# Patient Record
Sex: Female | Born: 1965 | Race: White | Hispanic: No | Smoking: Current every day smoker
Health system: Southern US, Community
[De-identification: ages and names within clinical notes are randomized; demographics above are authoritative.]

## PROBLEM LIST (undated history)

## (undated) DIAGNOSIS — J189 Pneumonia, unspecified organism: Secondary | ICD-10-CM

## (undated) DIAGNOSIS — J45909 Unspecified asthma, uncomplicated: Secondary | ICD-10-CM

## (undated) DIAGNOSIS — M549 Dorsalgia, unspecified: Secondary | ICD-10-CM

## (undated) DIAGNOSIS — G629 Polyneuropathy, unspecified: Secondary | ICD-10-CM

## (undated) DIAGNOSIS — M1990 Unspecified osteoarthritis, unspecified site: Secondary | ICD-10-CM

## (undated) DIAGNOSIS — M4800 Spinal stenosis, site unspecified: Secondary | ICD-10-CM

## (undated) DIAGNOSIS — M21371 Foot drop, right foot: Secondary | ICD-10-CM

## (undated) DIAGNOSIS — F32A Depression, unspecified: Secondary | ICD-10-CM

## (undated) HISTORY — PX: TUBAL LIGATION: SHX77

## (undated) HISTORY — PX: BREAST LUMPECTOMY: SHX2

## (undated) HISTORY — PX: ABDOMINAL HYSTERECTOMY: SHX81

## (undated) HISTORY — PX: CHOLECYSTECTOMY: SHX55

---

## 1998-05-31 ENCOUNTER — Emergency Department (HOSPITAL_COMMUNITY): Admission: EM | Admit: 1998-05-31 | Discharge: 1998-05-31 | Payer: Self-pay | Admitting: Internal Medicine

## 1998-09-06 ENCOUNTER — Encounter: Payer: Self-pay | Admitting: Emergency Medicine

## 1998-09-06 ENCOUNTER — Emergency Department (HOSPITAL_COMMUNITY): Admission: EM | Admit: 1998-09-06 | Discharge: 1998-09-06 | Payer: Self-pay | Admitting: Emergency Medicine

## 1999-09-29 ENCOUNTER — Emergency Department (HOSPITAL_COMMUNITY): Admission: EM | Admit: 1999-09-29 | Discharge: 1999-09-29 | Payer: Self-pay | Admitting: Emergency Medicine

## 1999-10-15 ENCOUNTER — Encounter: Payer: Self-pay | Admitting: Emergency Medicine

## 1999-10-15 ENCOUNTER — Emergency Department (HOSPITAL_COMMUNITY): Admission: EM | Admit: 1999-10-15 | Discharge: 1999-10-15 | Payer: Self-pay | Admitting: Emergency Medicine

## 2000-05-09 ENCOUNTER — Emergency Department (HOSPITAL_COMMUNITY): Admission: EM | Admit: 2000-05-09 | Discharge: 2000-05-09 | Payer: Self-pay | Admitting: Emergency Medicine

## 2012-06-16 DIAGNOSIS — R519 Headache, unspecified: Secondary | ICD-10-CM | POA: Insufficient documentation

## 2012-06-16 DIAGNOSIS — R51 Headache: Secondary | ICD-10-CM

## 2012-06-16 DIAGNOSIS — F329 Major depressive disorder, single episode, unspecified: Secondary | ICD-10-CM | POA: Insufficient documentation

## 2012-06-16 DIAGNOSIS — F32A Depression, unspecified: Secondary | ICD-10-CM | POA: Insufficient documentation

## 2012-06-22 DIAGNOSIS — E539 Vitamin B deficiency, unspecified: Secondary | ICD-10-CM | POA: Insufficient documentation

## 2013-12-14 ENCOUNTER — Emergency Department (HOSPITAL_COMMUNITY)
Admission: EM | Admit: 2013-12-14 | Discharge: 2013-12-14 | Disposition: A | Discharge status: Home / Self Care | Payer: Self-pay | Attending: Emergency Medicine | Admitting: Emergency Medicine

## 2013-12-14 ENCOUNTER — Encounter (HOSPITAL_COMMUNITY): Payer: Self-pay | Admitting: Emergency Medicine

## 2013-12-14 ENCOUNTER — Emergency Department (HOSPITAL_COMMUNITY): Payer: Self-pay

## 2013-12-14 DIAGNOSIS — R209 Unspecified disturbances of skin sensation: Secondary | ICD-10-CM | POA: Insufficient documentation

## 2013-12-14 DIAGNOSIS — M5416 Radiculopathy, lumbar region: Secondary | ICD-10-CM

## 2013-12-14 DIAGNOSIS — Z881 Allergy status to other antibiotic agents status: Secondary | ICD-10-CM | POA: Insufficient documentation

## 2013-12-14 DIAGNOSIS — IMO0002 Reserved for concepts with insufficient information to code with codable children: Secondary | ICD-10-CM | POA: Insufficient documentation

## 2013-12-14 DIAGNOSIS — M4800 Spinal stenosis, site unspecified: Secondary | ICD-10-CM | POA: Insufficient documentation

## 2013-12-14 DIAGNOSIS — F17200 Nicotine dependence, unspecified, uncomplicated: Secondary | ICD-10-CM | POA: Insufficient documentation

## 2013-12-14 DIAGNOSIS — Z885 Allergy status to narcotic agent status: Secondary | ICD-10-CM | POA: Insufficient documentation

## 2013-12-14 DIAGNOSIS — M48061 Spinal stenosis, lumbar region without neurogenic claudication: Secondary | ICD-10-CM

## 2013-12-14 DIAGNOSIS — M5116 Intervertebral disc disorders with radiculopathy, lumbar region: Secondary | ICD-10-CM

## 2013-12-14 DIAGNOSIS — Z880 Allergy status to penicillin: Secondary | ICD-10-CM | POA: Insufficient documentation

## 2013-12-14 DIAGNOSIS — M533 Sacrococcygeal disorders, not elsewhere classified: Secondary | ICD-10-CM | POA: Insufficient documentation

## 2013-12-14 HISTORY — DX: Dorsalgia, unspecified: M54.9

## 2013-12-14 MED ORDER — PREDNISONE 20 MG PO TABS: ORAL_TABLET | ORAL | Status: DC

## 2013-12-14 MED ORDER — ONDANSETRON 4 MG PO TBDP
8.0000 mg | ORAL_TABLET | Freq: Once | ORAL | Status: AC
  Administered 2013-12-14: 8 mg via ORAL
  Filled 2013-12-14: qty 2

## 2013-12-14 MED ORDER — HYDROCODONE-ACETAMINOPHEN 5-325 MG PO TABS
2.0000 | ORAL_TABLET | Freq: Once | ORAL | Status: AC
  Administered 2013-12-14: 2 via ORAL
  Filled 2013-12-14: qty 2

## 2013-12-14 NOTE — ED Provider Notes (Signed)
Patient's MRI, says she has spinal stenosis with a small bulge at the level of L4-5, which does not explain the patient's, symptoms.  She's been put on a 12 day prednisone taper, and referred to neurosurgery  Garald Balding, NP 12/14/13 2143

## 2013-12-14 NOTE — Discharge Instructions (Signed)
Her MRI shows that you have lumbar spinal stenosis, which is in narrowing of the canal, as well as a small bulge at the level of L4-5.   You have been referred to neurosurgery.  Please call and make an appointment

## 2013-12-14 NOTE — ED Notes (Signed)
Per pt sts for the past couple of days she is having lumbar pain associated with numbness. sts she is numb and burning from her left side to the middle of her abdomen and down her waist. sts she has a prior back injury. sts she has a knot on her back. sts that her legs are weak and she doesn't have control of her bowels or bladder.

## 2013-12-14 NOTE — ED Provider Notes (Signed)
CSN: [id number]     Arrival date & time 12/14/13  56 History   First MD Initiated Contact with Patient 12/14/13 1834     Chief Complaint  Patient presents with  . Back Pain  . Numbness     (Consider location/radiation/quality/duration/timing/severity/associated sxs/prior Treatment) HPI Comments: Patient is a 48 year old female who presents today with back pain and numbness. She reports this has been gradually worsening over the past week and a half. It is associated with numbness in her legs bilaterally and into her abdomen. She has had back issues for many years. She fells many years ago and had issues with L1-L5 and states her sacrum "pointed the wrong way, it was so messed up". She did not have surgery for this. She reports that while she was in [place] she had to have "life line" where she was flown to a different hospital. She had "fluid taken from her back". She is unsure of why this was done or her final diagnosis. She has had no new injury, but states her balance is worsening and she falls frequently. She reports that she cannot feel her feet and cannot walk, but walks with a steady gait in the ED. She reports that she is no longer able to have a bowel movement. She reports urinary incontinence. She denies, fevers, chills, shortness of breath, chest pain. She denies any drug use or history of cancer.   The history is provided by the patient. No language interpreter was used.    Past Medical History  Diagnosis Date  . Back pain    Past Surgical History  Procedure Laterality Date  . Tubal ligation    . Cholecystectomy     History reviewed. No pertinent family history. History  Substance Use Topics  . Smoking status: Current Every Day Smoker  . Smokeless tobacco: Not on file  . Alcohol Use: No   OB History   Grav Para Term Preterm Abortions TAB SAB Ect Mult Living                 Review of Systems  Constitutional: Negative for fever and chills.  Respiratory: Negative for  shortness of breath.   Cardiovascular: Negative for chest pain.  Gastrointestinal: Negative for nausea, vomiting and abdominal pain.  Musculoskeletal: Positive for arthralgias, back pain, gait problem and myalgias.  All other systems reviewed and are negative.      Allergies  Amoxicillin; Oxycodone; Oxycontin; and Penicillins  Home Medications  No current outpatient prescriptions on file. BP 126/60  Pulse 86  Temp(Src) 98.6 F (37 C) (Oral)  Resp 16  Wt 151 lb (68.493 kg)  SpO2 99%  LMP 11/16/2013 Physical Exam  Nursing note and vitals reviewed. Constitutional: She is oriented to person, place, and time. She appears well-developed and well-nourished. She does not appear ill. No distress.  Well appearing  HENT:  Head: Normocephalic and atraumatic.  Right Ear: External ear normal.  Left Ear: External ear normal.  Nose: Nose normal.  Mouth/Throat: Oropharynx is clear and moist.  Eyes: Conjunctivae and EOM are normal. Pupils are equal, round, and reactive to light.  Neck: Normal range of motion.  Cardiovascular: Normal rate, regular rhythm, normal heart sounds, intact distal pulses and normal pulses.   Pulses:      Radial pulses are 2+ on the right side, and 2+ on the left side.       Dorsalis pedis pulses are 2+ on the right side, and 2+ on the left side.  Posterior tibial pulses are 2+ on the right side, and 2+ on the left side.  Capillary refill < 3 seconds in all toes  Pulmonary/Chest: Effort normal and breath sounds normal. No stridor. No respiratory distress. She has no wheezes. She has no rales.  Abdominal: Soft. She exhibits no distension. There is no tenderness.  Genitourinary: Rectum normal.  Normal rectal tone. Anal wink present.   Musculoskeletal: Normal range of motion.  Moves all extremities  Neurological: She is alert and oriented to person, place, and time. She has normal strength and normal reflexes. No sensory deficit. Coordination and gait normal.   Reflex Scores:      Patellar reflexes are 2+ on the right side and 2+ on the left side. Patient is able to differentiate between sharp and dull bilaterally on feet.  Patient is able to ambulate with strong, steady gait which is not antalgic.   Skin: Skin is warm and dry. She is not diaphoretic. No erythema.  Psychiatric: She has a normal mood and affect. Her behavior is normal.    ED Course  Procedures (including critical care time) Labs Review Labs Reviewed - No data to display Imaging Review No results found.   EKG Interpretation None      MDM   Final diagnoses:  None   Patient presents to ED with back pain, numbness, and urinary incontinence. She reports she is no longer able to have a bowel movement. She does have normal rectal tone. Patient denies any recent illness. Reflexes are normal and symmetric. No concern for Raynald Blend at this point. MR lumbar spine done to rule out discitis vs cauda equina. MR pending. Patient signed out to Olean Ree, NP at change of shift. Vital signs stable at this point. Discussed case with Dr. Tomi Bamberger who agrees with plan. Patient / Family / Caregiver informed of clinical course, understand medical decision-making process, and agree with plan.     Elwyn Lade, PA-C 12/16/13 [phone]

## 2013-12-18 NOTE — ED Provider Notes (Signed)
Medical screening examination/treatment/procedure(s) were performed by non-physician practitioner and as supervising physician I was immediately available for consultation/collaboration.    Kathalene Frames, MD 12/18/13 [phone]

## 2014-06-06 DIAGNOSIS — N319 Neuromuscular dysfunction of bladder, unspecified: Secondary | ICD-10-CM | POA: Insufficient documentation

## 2014-06-06 DIAGNOSIS — K592 Neurogenic bowel, not elsewhere classified: Secondary | ICD-10-CM | POA: Insufficient documentation

## 2014-06-06 DIAGNOSIS — M5416 Radiculopathy, lumbar region: Secondary | ICD-10-CM | POA: Insufficient documentation

## 2014-06-06 DIAGNOSIS — G373 Acute transverse myelitis in demyelinating disease of central nervous system: Secondary | ICD-10-CM | POA: Insufficient documentation

## 2014-11-13 ENCOUNTER — Emergency Department (HOSPITAL_COMMUNITY)
Admission: EM | Admit: 2014-11-13 | Discharge: 2014-11-14 | Disposition: A | Discharge status: Home / Self Care | Payer: Medicaid Other | Attending: Emergency Medicine | Admitting: Emergency Medicine

## 2014-11-13 ENCOUNTER — Encounter (HOSPITAL_COMMUNITY): Payer: Self-pay | Admitting: *Deleted

## 2014-11-13 DIAGNOSIS — S20212A Contusion of left front wall of thorax, initial encounter: Secondary | ICD-10-CM | POA: Insufficient documentation

## 2014-11-13 DIAGNOSIS — T148XXA Other injury of unspecified body region, initial encounter: Secondary | ICD-10-CM

## 2014-11-13 DIAGNOSIS — Z880 Allergy status to penicillin: Secondary | ICD-10-CM | POA: Diagnosis not present

## 2014-11-13 DIAGNOSIS — G8929 Other chronic pain: Secondary | ICD-10-CM | POA: Diagnosis not present

## 2014-11-13 DIAGNOSIS — G629 Polyneuropathy, unspecified: Secondary | ICD-10-CM | POA: Insufficient documentation

## 2014-11-13 DIAGNOSIS — Z79899 Other long term (current) drug therapy: Secondary | ICD-10-CM | POA: Insufficient documentation

## 2014-11-13 DIAGNOSIS — S3992XA Unspecified injury of lower back, initial encounter: Secondary | ICD-10-CM | POA: Insufficient documentation

## 2014-11-13 DIAGNOSIS — S299XXA Unspecified injury of thorax, initial encounter: Secondary | ICD-10-CM | POA: Diagnosis present

## 2014-11-13 DIAGNOSIS — Y9389 Activity, other specified: Secondary | ICD-10-CM | POA: Insufficient documentation

## 2014-11-13 DIAGNOSIS — Y92009 Unspecified place in unspecified non-institutional (private) residence as the place of occurrence of the external cause: Secondary | ICD-10-CM | POA: Insufficient documentation

## 2014-11-13 DIAGNOSIS — S50812A Abrasion of left forearm, initial encounter: Secondary | ICD-10-CM | POA: Insufficient documentation

## 2014-11-13 DIAGNOSIS — W19XXXA Unspecified fall, initial encounter: Secondary | ICD-10-CM

## 2014-11-13 DIAGNOSIS — Z7952 Long term (current) use of systemic steroids: Secondary | ICD-10-CM | POA: Diagnosis not present

## 2014-11-13 DIAGNOSIS — Z8719 Personal history of other diseases of the digestive system: Secondary | ICD-10-CM | POA: Diagnosis not present

## 2014-11-13 DIAGNOSIS — M545 Low back pain, unspecified: Secondary | ICD-10-CM

## 2014-11-13 DIAGNOSIS — Z720 Tobacco use: Secondary | ICD-10-CM | POA: Insufficient documentation

## 2014-11-13 DIAGNOSIS — Y998 Other external cause status: Secondary | ICD-10-CM | POA: Diagnosis not present

## 2014-11-13 DIAGNOSIS — W1839XA Other fall on same level, initial encounter: Secondary | ICD-10-CM | POA: Insufficient documentation

## 2014-11-13 HISTORY — DX: Spinal stenosis, site unspecified: M48.00

## 2014-11-13 HISTORY — DX: Polyneuropathy, unspecified: G62.9

## 2014-11-13 MED ORDER — IBUPROFEN 200 MG PO TABS
400.0000 mg | ORAL_TABLET | Freq: Once | ORAL | Status: AC
  Administered 2014-11-14: 400 mg via ORAL
  Filled 2014-11-13: qty 2

## 2014-11-13 NOTE — ED Notes (Signed)
Pt to ED via [facility] c/o of a fall onto cement, c/o pain to left side of back and ribs. Pt with hx of back injury. ETOH on board

## 2014-11-13 NOTE — ED Provider Notes (Signed)
CSN: [id number]     Arrival date & time 11/13/14  2344 History   First MD Initiated Contact with Patient 11/13/14 2345     Chief Complaint  Patient presents with  . Fall     (Consider location/radiation/quality/duration/timing/severity/associated sxs/prior Treatment) HPI  Bridget [name] is a 49 y.o. female with past medical history significant for chronic back pain, spinal stenosis and neuropathy brought in by EMS status post slip and fall at her home. She is reporting 10 out of 10 left anterior rib pain, low back pain. Patient denies head trauma, LOC, nausea, vomiting, change in vision, cervicalgia, difficulty moving major joints. She has been ambulatory since the event. States that she had 3 beers tonight.  Past Medical History  Diagnosis Date  . Back pain   . Spinal stenosis   . Neuropathy    Past Surgical History  Procedure Laterality Date  . Tubal ligation    . Cholecystectomy     History reviewed. No pertinent family history. History  Substance Use Topics  . Smoking status: Current Every Day Smoker  . Smokeless tobacco: Not on file  . Alcohol Use: Yes   OB History    No data available     Review of Systems  10 systems reviewed and found to be negative, except as noted in the HPI.  Allergies  Amoxicillin; Oxycodone; Oxycontin; and Penicillins  Home Medications   Prior to Admission medications   Medication Sig Start Date End Date Taking? Authorizing Provider  baclofen (LIORESAL) 10 MG tablet Take 10 mg by mouth at bedtime.   Yes Historical Provider, MD  pregabalin (LYRICA) 25 MG capsule Take 25-50 mg by mouth 2 (two) times daily. 50mg  in the morning and 25mg  in the evening   Yes Historical Provider, MD  promethazine (PHENERGAN) 25 MG tablet Take 25 mg by mouth every 6 (six) hours as needed for nausea or vomiting.   Yes Historical Provider, MD  topiramate (TOPAMAX) 25 MG tablet Take 25 mg by mouth at bedtime.   Yes Historical Provider, MD  HYDROcodone-acetaminophen  (NORCO/VICODIN) 5-325 MG per tablet Take 1-2 tablets by mouth every 6 hours as needed for pain and/or cough. 11/14/14   Manases Etchison, PA-C  predniSONE (DELTASONE) 20 MG tablet 3 Tabs PO Days 1-3, then 2 tabs PO Days 4-6, then 1 tab PO Day 7-9, then Half Tab PO Day 10-12 Patient not taking: Reported on 11/14/2014 12/14/13   Garald Balding, NP   LMP 10/24/2014 Physical Exam  Constitutional: She is oriented to person, place, and time. She appears well-developed and well-nourished. No distress.  HENT:  Head: Normocephalic and atraumatic.  Mouth/Throat: Oropharynx is clear and moist.  No abrasions or contusions.   No hemotympanum, battle signs or raccoon's eyes  No crepitance or tenderness to palpation along the orbital rim.  EOMI intact with no pain or diplopia  No abnormal otorrhea or rhinorrhea. Nasal septum midline.  No intraoral trauma.      Eyes: Conjunctivae and EOM are normal. Pupils are equal, round, and reactive to light.  Neck: Normal range of motion. Neck supple.  Cardiovascular: Normal rate, regular rhythm and intact distal pulses.   Pulmonary/Chest: Effort normal and breath sounds normal. No stridor. No respiratory distress. She has no wheezes. She has no rales. She exhibits tenderness.    Tenderness palpation on left anterior chest, this is focal point tenderness, no crepitance, lung sounds are clear to auscultation  Abdominal: Soft. Bowel sounds are normal. She exhibits no distension  and no mass. There is no tenderness. There is no rebound and no guarding.  Tissue wearing adult diapers, states she is incontinent at baseline.  Musculoskeletal: Normal range of motion.  Neurological: She is alert and oriented to person, place, and time.  Follows commands, Clear, goal oriented speech, Strength is 5 out of 5x4 extremities, patient ambulates with a coordinated in nonantalgic gait. Sensation is grossly intact.   Skin:  Partial thickness abrasion to left forearm (patient  states last tetanus shot was within the last 3 years)  Psychiatric: She has a normal mood and affect.  Nursing note and vitals reviewed.   ED Course  Procedures (including critical care time) Labs Review Labs Reviewed - No data to display  Imaging Review Dg Ribs Unilateral W/chest Left  11/14/2014   CLINICAL DATA:  Patient fell onto cement landscaping. Left-sided back and rib pain. Previous history of back injury with weakness in the right leg prompt fall tonight.  EXAM: LEFT RIBS AND CHEST - 3+ VIEW  COMPARISON:  05/19/2014  FINDINGS: Normal heart size and pulmonary vascularity. No focal airspace disease or consolidation in the lungs. No blunting of costophrenic angles. No pneumothorax. Mediastinal contours appear intact.  Left ribs appear intact. No displaced fractures or focal bone lesions appreciated.  IMPRESSION: Negative.   Electronically Signed   By: Lucienne Capers M.D.   On: 11/14/2014 00:48   Dg Cervical Spine Complete  11/14/2014   CLINICAL DATA:  Status post fall onto cement landscaping; acute onset of neck pain. Initial encounter.  EXAM: CERVICAL SPINE  4+ VIEWS  COMPARISON:  None.  FINDINGS: There is no evidence of fracture or subluxation. Vertebral bodies demonstrate normal height and alignment. There is minimal multilevel disc space narrowing along the mid and lower cervical spine, with a few small anterior and posterior disc osteophyte complexes. Prevertebral soft tissues are within normal limits. The provided odontoid view demonstrates no significant abnormality.  The visualized lung apices are clear.  IMPRESSION: No evidence of fracture or subluxation along the cervical spine. Minimal degenerative change along the mid to lower cervical spine.   Electronically Signed   By: Garald Balding M.D.   On: 11/14/2014 00:49   Dg Lumbar Spine Complete  11/14/2014   CLINICAL DATA:  Status post fall onto cement landscaping. Left lower back pain. Initial encounter.  EXAM: LUMBAR SPINE - COMPLETE  4+ VIEW  COMPARISON:  MRI of the lumbar spine performed 12/14/2013, and lumbar spine radiographs performed 11/04/2013  FINDINGS: There is no evidence of fracture or subluxation. Vertebral bodies demonstrate normal height and alignment. Intervertebral disc spaces are preserved. The visualized neural foramina are grossly unremarkable in appearance.  The visualized bowel gas pattern is unremarkable in appearance; air and stool are noted within the colon. The sacroiliac joints are within normal limits. Clips are noted within the right upper quadrant, reflecting prior cholecystectomy.  IMPRESSION: No evidence of fracture or subluxation along the lumbar spine.   Electronically Signed   By: Garald Balding M.D.   On: 11/14/2014 00:48     EKG Interpretation None      MDM   Final diagnoses:  Fall at home  Rib contusion, left, initial encounter  Acute exacerbation of chronic low back pain  Abrasion    Filed Vitals:   11/14/14 0122  BP: 106/67  Pulse: 78  Temp: 97.8 F (36.6 C)  TempSrc: Oral  Resp: 16  SpO2: 100%    Medications  HYDROcodone-acetaminophen (NORCO/VICODIN) 5-325 MG per tablet 1 tablet (  not administered)  ibuprofen (ADVIL,MOTRIN) tablet 400 mg (400 mg Oral Given 11/14/14 0005)    Bridget [name] is a pleasant 49 y.o. female presenting with left anterior chest, low back pain status post slip and fall at home, no signs of head trauma, patient states that she has had some alcohol, she fails Nexus criteria for intoxication or distracting injury, will x-ray C-spine as well.  Plain films with no abnormalities, patient has an allergy to oxycodone but states she has taken hydrocodone in the past without issue.  Evaluation does not show pathology that would require ongoing emergent intervention or inpatient treatment. Pt is hemodynamically stable and mentating appropriately. Discussed findings and plan with patient/guardian, who agrees with care plan. All questions answered. Return  precautions discussed and outpatient follow up given.   New Prescriptions   HYDROCODONE-ACETAMINOPHEN (NORCO/VICODIN) 5-325 MG PER TABLET    Take 1-2 tablets by mouth every 6 hours as needed for pain and/or cough.         Monico Blitz, PA-C 11/14/14 [place], DO 11/14/14 2836

## 2014-11-14 ENCOUNTER — Emergency Department (HOSPITAL_COMMUNITY): Payer: Medicaid Other

## 2014-11-14 MED ORDER — HYDROCODONE-ACETAMINOPHEN 5-325 MG PO TABS: ORAL_TABLET | ORAL | Status: DC

## 2014-11-14 MED ORDER — HYDROCODONE-ACETAMINOPHEN 5-325 MG PO TABS
1.0000 | ORAL_TABLET | Freq: Once | ORAL | Status: AC
  Administered 2014-11-14: 1 via ORAL
  Filled 2014-11-14: qty 1

## 2014-11-14 NOTE — Discharge Instructions (Signed)
Take percocet for breakthrough pain, do not drink alcohol, drive, care for children or do other critical tasks while taking percocet.   It is very important that you take deep breaths to prevent lung collapse and infection.  Take 10 deep breaths every hour to prevent lung collapse.  If you develop cough, fever or shortness of breath return immediately to the emergency room.   Do not hesitate to return to the emergency room for any new, worsening or concerning symptoms.  Please obtain primary care using resource guide below. But the minute you were seen in the emergency room and that they will need to obtain records for further outpatient management.    Emergency Department Resource Guide 1) Find a Doctor and Pay Out of Pocket Although you won't have to find out who is covered by your insurance plan, it is a good idea to ask around and get recommendations. You will then need to call the office and see if the doctor you have chosen will accept you as a new patient and what types of options they offer for patients who are self-pay. Some doctors offer discounts or will set up payment plans for their patients who do not have insurance, but you will need to ask so you aren't surprised when you get to your appointment.  2) Contact Your Local Health Department Not all health departments have doctors that can see patients for sick visits, but many do, so it is worth a call to see if yours does. If you don't know where your local health department is, you can check in your phone book. The CDC also has a tool to help you locate your state's health department, and many state websites also have listings of all of their local health departments.  3) Find a [facility] If your illness is not likely to be very severe or complicated, you may want to try a walk in clinic. These are popping up all over the country in pharmacies, drugstores, and shopping centers. They're usually staffed by nurse practitioners  or physician assistants that have been trained to treat common illnesses and complaints. They're usually fairly quick and inexpensive. However, if you have serious medical issues or chronic medical problems, these are probably not your best option.  No Primary Care Doctor: - Call Health Connect at  [phone] - they can help you locate a primary care doctor that  accepts your insurance, provides certain services, etc. - Physician Referral Service- [phone]  Chronic Pain Problems: Organization         Address  Phone   Notes  [facility]  [phone] Patients need to be referred by their primary care doctor.   Medication Assistance: Organization         Address  Phone   Notes  [facility] [address] [place], [place] [address] [phone] --Must be a resident of [facility] -- Must have NO insurance coverage whatsoever (no Medicaid/ Medicare, etc.) -- The pt. MUST have a primary care doctor that directs their care regularly and follows them in the community   MedAssist  [phone]   [employer]  [phone]    Agencies that provide inexpensive medical care: Organization         Address  Phone   Notes  [place]  [phone]   [facility]    [phone]   [facility] [place]  [place], [place] [address] [phone]   [place] [address] [place] [phone]   Planned Parenthood    [phone]   [facility]    [phone]   [place] and [place] [address], [place] Phone:  [phone], Fax:  [phone] Hours of Operation:  9 am - 6 pm, M-F.  Also accepts Medicaid/Medicare and self-pay.  [facility] for [place], [address], [place] Phone: [phone], Fax: [phone]. Hours of Operation:  8:30 am - 5:30 pm, M-F.   Also accepts Medicaid and self-pay.  [facility] [place] [address] [place] Phone: [phone]   [place], [place], [place] [phone], Ext. [phone] Mondays & Thursdays: 7-9 AM.  First 15 patients are seen on a first come, first serve basis.    [place] Providers:  Organization         Address  Phone   Notes  [facility] [address],  [phone] Also accepts self-pay patients.  [facility] [address], [place]  [phone]   [place], [address], [place] [phone]   [facility] [address], [place] [phone]   Lucianne Lei [address], [place]   [phone] Only accepts [place] Access [place] patients after they have their name applied to their card.   Self-Pay (no insurance) in [facility]:  Organization         Address  Phone   Notes  Sickle Cell Patients, [facility] [place] [phone]   [facility] [place] [phone]   [facility] [place]  [place], [place], [place] [phone]   [facility]/Dr. Osei-Bonsu  [address], [place] or [address], [place] [phone] Phone number for both [place] and [place] locations is the same.  Urgent Medical and [facility] [address] [place] [phone]   [facility] [address], [place] or [address] [phone]   [facility] [address] [place] [phone], phone; [phone], fax Sees patients 1st and 3rd Saturday of every month.  Must not qualify for public or private insurance (i.e. Medicaid, Medicare, [place] Health Choice, Veterans'  Benefits)  Household income should be no more than 200% of the poverty level The clinic cannot treat you if you are pregnant or think you are pregnant  Sexually transmitted diseases are not treated at the clinic.    Dental Care: Organization         Address  Phone  Notes  [facility] Department of [facility] [place] [phone] Accepts children up to age 66 who are enrolled in [place] or [place]; pregnant women with a Medicaid card; and children who have applied for Medicaid or [place] Health Choice, but were declined, whose parents can pay a reduced fee at time of service.  [facility] Department of [facility]  [address], [place] [phone] Accepts children up to age 26 who are enrolled in [place] or   Health Choice; pregnant women with a Medicaid card; and children who have applied for Medicaid or [place] Health Choice, but were declined, whose parents can pay a reduced fee at time of service.  [place] Adult Dental Access PROGRAM  [place] [phone] Patients are seen by appointment only. Walk-ins are not accepted. [place] will see patients 84 years of age and older. Monday - Tuesday (8am-5pm) Most Wednesdays (8:30-5pm) $30 per visit, cash only  [facility] Adult Dental Access PROGRAM  [address], [facility] [phone] Patients are seen by appointment only. Walk-ins are not accepted. [place] will see patients 22 years of age and older. One Wednesday Evening (Monthly: Volunteer Based).  $30 per visit, cash only  [place]  [phone] for adults; Children under age 56, call Graduate Pediatric Dentistry at [phone]. Children aged 96-14, please call [phone] to request a pediatric application.  Dental services are provided in all areas of dental care including fillings, crowns and bridges, complete and partial  dentures, implants, gum treatment, root canals, and extractions. Preventive care is also provided. Treatment is provided to both adults and children. Patients are selected via a lottery and there is often a waiting list.   [facility] [address] [place]  [phone] [email or web address]   [facility] [address] [place], [place] [phone], Ext. [phone] Second and Fourth Thursday of each month, opens at 6:30 AM; Clinic ends at 9 AM.  Patients are seen on a first-come first-served basis, and a limited number are seen during each clinic.   [facility]  [address] Hillard Danker [place], [place] [phone]   Eligibility Requirements You must have lived in [place], [place], or [place] counties for at least the last three months.   You cannot be eligible for state or federal sponsored [employer], including [employer], [place], or [employer].   You generally cannot be eligible for healthcare insurance through your employer.    How to apply: Eligibility screenings are held every Tuesday and Wednesday afternoon from 1:00 pm until 4:00 pm. You do not need an appointment for the interview!  [facility] [address] [place], [place]   [place]  [place] Department  [place]  [phone]    Behavioral Health Resources in the Community: Intensive Outpatient Programs Organization         Address  Phone  Notes  [place]. [address] [place], [place] [phone]   [facility] Outpatient [address] [place], [place]   ADS: Alcohol & Drug Svcs [address] [place], [place]   [place] [address]  [place], [place] or [phone]   Substance Abuse Resources Organization          Address  Phone  Notes  Alcohol and Drug Services  [phone]   [place]  [phone]   [facility] [place]   Chinita Pester  [phone]   Residential & Outpatient Substance Abuse Program  [phone]   Psychological Services Organization         Address  Phone  Notes  [facility] [place]  [place]  [phone]   [place] [address] [place] or [phone]  Mobile Crisis Teams Organization         Address  Phone  Notes  [facility], Mobile Crisis Care Unit  [phone]   [facility]ive Psychotherapeutic Services  [address] [place], [place]   [place] Levels [address], [place] [phone]    Self-Help/Support Groups Organization         Address  Phone             Notes  [facility]. of [place] - variety of support groups  [place] Call for more information  Narcotics Anonymous (NA), [facility] [address], [employer] [place]  2 meetings at this location   Special educational needs teacher         Address  Phone  Notes  ASAP Residential Treatment [place],    [place]  [phone]   [facility]  [address] [place] [phone], [place], [place]   [place], [place] [phone] Admissions: 8am-3pm M-F  Incentives Substance [place] [address]    [place], [place] [phone]   [facility] [address] [place], [place], [place]   [facility] [address]  [place], [place]   Insight Programs - Intensive Outpatient [address], [place], [place]   [facility] ([place].) [place].,  [place], [place] [phone] or [phone]   Residential Treatment Services (RTS) [address], [place], [place] Accepts Medicaid  [facility] [place] [address],  [place] [phone] Substance Abuse/Addiction Treatment   [facility] Organization         Address  Phone  Notes  [facility]  [phone]   Domenic Schwab, PhD [address] [place], [place]   [phone] or [phone]   [place], [place] [phone]   [facility] [address], [place], [place] [phone] Insurance/Medicaid/sponsorship through [facility] and Families [address] [place]                                    [place], [place] [phone] [place] [address], [place] [phone]    Dr. Adele Schilder  [phone]   [facility] Dept. 1) [address], [place] 2) [place] 3)  [place] [address], [place] [phone]  [phone]   [place] [phone] or [phone] (After Hours)

## 2015-03-22 ENCOUNTER — Other Ambulatory Visit: Payer: Self-pay | Admitting: Neurosurgery

## 2015-03-22 DIAGNOSIS — M4804 Spinal stenosis, thoracic region: Secondary | ICD-10-CM

## 2015-03-28 ENCOUNTER — Ambulatory Visit
Admission: RE | Admit: 2015-03-28 | Discharge: 2015-03-28 | Disposition: A | Discharge status: Home / Self Care | Payer: Medicaid Other | Source: Ambulatory Visit | Attending: Neurosurgery | Admitting: Neurosurgery

## 2015-03-28 DIAGNOSIS — M4804 Spinal stenosis, thoracic region: Secondary | ICD-10-CM

## 2015-03-28 MED ORDER — GADOBENATE DIMEGLUMINE 529 MG/ML IV SOLN
15.0000 mL | Freq: Once | INTRAVENOUS | Status: AC | PRN
  Administered 2015-03-28: 15 mL via INTRAVENOUS

## 2015-06-07 ENCOUNTER — Encounter: Payer: Self-pay | Admitting: Neurology

## 2015-06-07 ENCOUNTER — Ambulatory Visit (INDEPENDENT_AMBULATORY_CARE_PROVIDER_SITE_OTHER): Payer: Medicaid Other | Admitting: Neurology

## 2015-06-07 VITALS — BP 110/64 | HR 44 | Ht 62.0 in | Wt 166.0 lb

## 2015-06-07 DIAGNOSIS — G959 Disease of spinal cord, unspecified: Secondary | ICD-10-CM | POA: Diagnosis not present

## 2015-06-07 NOTE — Progress Notes (Signed)
NEUROLOGY CONSULTATION NOTE  Bridget [name] MRN: [id number] DOB: 09-24-1966  Referring provider: Dr. Arnoldo Morale Primary care provider: Dr. Kalman Shan  Reason for consult:  Spinal cord lesion, rule out possible MS  HISTORY OF PRESENT ILLNESS: Bridget [name] is a 49 year old left-handed female with migraines and current smoker who presents for evaluation of MS.  Limited history obtained by patient and neurosurgery office notes.  Images of thoracic spine with contrast personally reviewed.  In 2013, she was living in [place], when she slipped and landed on her back.  She said she felt a "pop" and developed severe back pain.  The next morning, she felt numb from the waist down and could barely move her legs.  [facility] was [facility], where she said they did tests, including a lumbar puncture.  She is unsure of the test results and what was her diagnosis, but she was airlifted to [facility] for further care.  She underwent rehab and two months later moved up to [place].  She said she saw a chiropractor who performed injections in her back, which stopped after she developed bruising.  She also received injections in her feet, which caused numbness up to below the knees.  Over the past year, she has had a gradual decline in her gait.  She requires an assisted device when she is outside of the house.  She uses a rolling walker with seat.  She also has bowel and bladder incontinence.  She continues to have severe bilateral low back pain.  She continues to have numbness in the feet up to below the knees.  She saw Dr. [facility]o Morale of [facility], who ordered an MRI of the lumbar spine on 02/23/15, which only showed a shallow disc protrusion at L4-5 and minor facet arthropathy.  MRI of the thoracic spine without contrast showed a lesion in the T2 to T4 levels with moderate stenosis.  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  She had an MRI of the brain without contrast performed on 04/13/15, which showed scattered subcortical and periventricular T2 hyperintensities.  She denies prior history of focal numbness or weakness or vision loss.  She has no family history of MS.  PAST MEDICAL HISTORY: No past medical history on file.  PAST SURGICAL HISTORY: Past Surgical History  Procedure Laterality Date  . Tubal ligation    . Cholecystectomy    . Breast lumpectomy Right     benign    MEDICATIONS: No current outpatient prescriptions on file prior to visit.   No current facility-administered medications on file prior to visit.    ALLERGIES: Allergies  Allergen Reactions  . Multihance [Gadobenate] Nausea And Vomiting    She tolerated the post contrast exam well, took her about 5 minutes to feel better.     FAMILY HISTORY: Family History  Problem Relation Age of Onset  . Kidney cancer Mother     SOCIAL HISTORY: Social History   Social History  . Marital Status: Single    Spouse Name: N/A  . Number of Children: N/A  . Years of Education: N/A   Occupational History  . Not on file.   Social History Main Topics  . Smoking status: Current Every Day Smoker -- 0.50 packs/day    Types: Cigarettes  . Smokeless tobacco: Not on  file  . Alcohol Use: No  . Drug Use: No  . Sexual Activity: Not on file   Other Topics Concern  . Not on file   Social History Narrative  . No narrative on file    REVIEW OF SYSTEMS: Constitutional: No fevers, chills, or sweats, no generalized fatigue, change in appetite Eyes: No visual changes, double vision, eye pain Ear, nose and throat: No hearing loss, ear pain, nasal congestion, sore throat Cardiovascular: No chest pain, palpitations Respiratory:  No shortness of breath at rest or with exertion, wheezes GastrointestinaI: No nausea, vomiting,  diarrhea, abdominal pain, fecal incontinence Genitourinary:  No dysuria, urinary retention or frequency Musculoskeletal:  No neck pain, back pain Integumentary: No rash, pruritus, skin lesions Neurological: as above Psychiatric: No depression, insomnia, anxiety Endocrine: No palpitations, fatigue, diaphoresis, mood swings, change in appetite, change in weight, increased thirst Hematologic/Lymphatic:  No anemia, purpura, petechiae. Allergic/Immunologic: no itchy/runny eyes, nasal congestion, recent allergic reactions, rashes  PHYSICAL EXAM: Filed Vitals:   06/07/15 1220  BP: 110/64  Pulse: 44   General: No acute distress.  Patient appears well-groomed.  Head:  Normocephalic/atraumatic Eyes:  fundi unremarkable, without vessel changes, exudates, hemorrhages or papilledema. Neck: supple, no paraspinal tenderness, full range of motion.  Lhermitte sign absent. Back: bilateral mid-lower paraspinal tenderness Heart: regular rate and rhythm Lungs: Clear to auscultation bilaterally. Vascular: No carotid bruits. Neurological Exam: Mental status: alert and oriented to person, place, and time, recent and remote memory intact, fund of knowledge intact, attention and concentration intact, speech fluent and not dysarthric, language intact. Cranial nerves: CN I: not tested CN II: pupils equal, round and reactive to light, visual fields intact, fundi unremarkable, without vessel changes, exudates, hemorrhages or papilledema. CN III, IV, VI:  full range of motion, no nystagmus, no ptosis CN V: facial sensation intact CN VII: upper and lower face symmetric CN VIII: hearing intact CN IX, X: gag intact, uvula midline CN XI: sternocleidomastoid and trapezius muscles intact CN XII: tongue midline Bulk & Tone: normal, no fasciculations. Motor:  5/5 throughout. Sensation:  Pinprick and vibration sensation intact, however she has hyperesthesia to both modality testing from the feet up to the knees. Deep  Tendon Reflexes:  2+ and symmetric in upper extremities, 3+ and symmetric in patellars and Achilles without clonus, bilateral Babinski.  Hoffman sign absent. Finger to nose testing:  Without dysmetria. Gait:  Spastic.  unable to turn and tandem walk. Romberg positive.  IMPRESSION: Unfortunately, history is hazy and incomplete.  She was worked up for acute myelopathy after a back injury in 2013, but it appears they were performing a workup for demyelinating disease as she underwent LP.  She does not remember results or what diagnosis she was given at the time. Thoracic spinal cord lesion with myelopathy.  Images suggest most likely a mass lesion. Abnormal white matter on MRI of brain.  Unfortunately, I do not have the images to review.  Differential diagnosis may be demyelinating disease or related to smoking and history of migraine Tobacco abuse  PLAN: 1.  Will obtain disc of brain MRI to review 2.  Will obtain records of hospital notes and workup from [place] to review. 3.  Follow up and further recommendations pending these results. 4.  Smoking cessation discussed.  Thank you for allowing me to take part in the care of this patient.  Metta Clines, DO  CC:  Rosanna Randy, MD  Newman Pies, MD

## 2015-06-07 NOTE — Patient Instructions (Signed)
Will need you to get me a disc with the MRI of the brain Will need to get notes from the hospitals in [place] Follow up after review of records and MRI

## 2015-06-08 ENCOUNTER — Encounter: Payer: Self-pay | Admitting: Neurology

## 2015-06-23 ENCOUNTER — Telehealth: Payer: Self-pay | Admitting: Neurology

## 2015-06-23 NOTE — Telephone Encounter (Signed)
No,I haven't. 

## 2015-06-23 NOTE — Telephone Encounter (Signed)
Pt called and wanted to know if Dr Tomi Likens received her disc with testing on it for review/Dawn CB# [phone]

## 2015-06-23 NOTE — Telephone Encounter (Signed)
Have you seen this? I have not.  

## 2015-06-26 NOTE — Telephone Encounter (Signed)
Spoke with pt. Pt. Is going to pick up disc and bring by the office one day this week. Thanks.  [facility]

## 2015-06-29 ENCOUNTER — Telehealth: Payer: Self-pay

## 2015-06-29 NOTE — Telephone Encounter (Signed)
Rec'd records from [facility]Forwarding 189 page's to Dr.?

## 2015-06-30 ENCOUNTER — Telehealth: Payer: Self-pay | Admitting: General Practice

## 2015-06-30 NOTE — Telephone Encounter (Signed)
Left patient a vm to call me back.  We have received records for her but we do not have a standing appointment.

## 2015-07-04 ENCOUNTER — Telehealth: Payer: Self-pay | Admitting: *Deleted

## 2015-07-04 NOTE — Telephone Encounter (Signed)
Spoke with patient she will be here this week to sign release to get records from [facility]

## 2015-07-04 NOTE — Telephone Encounter (Signed)
Called patient and left message for her to return my call. Need medical release resigned to get records from [place] and the location of where she was in the hospital at . This was suppose to have been done already. Cant locate release or records will have to get patient to resign release

## 2015-07-05 DIAGNOSIS — R002 Palpitations: Secondary | ICD-10-CM | POA: Insufficient documentation

## 2015-07-05 DIAGNOSIS — R011 Cardiac murmur, unspecified: Secondary | ICD-10-CM | POA: Insufficient documentation

## 2015-07-05 DIAGNOSIS — R42 Dizziness and giddiness: Secondary | ICD-10-CM | POA: Insufficient documentation

## 2015-07-05 NOTE — Telephone Encounter (Signed)
Have not heard anything back from patient.  I am shredding medical records.

## 2015-07-07 NOTE — Telephone Encounter (Signed)
Medical release on desk if she comes in for her to sign. She has not come in this week

## 2015-07-10 ENCOUNTER — Ambulatory Visit: Payer: Medicaid Other | Admitting: Neurology

## 2015-07-19 DIAGNOSIS — I493 Ventricular premature depolarization: Secondary | ICD-10-CM | POA: Insufficient documentation

## 2015-07-19 DIAGNOSIS — E0590 Thyrotoxicosis, unspecified without thyrotoxic crisis or storm: Secondary | ICD-10-CM | POA: Insufficient documentation

## 2015-08-14 DIAGNOSIS — N921 Excessive and frequent menstruation with irregular cycle: Secondary | ICD-10-CM | POA: Insufficient documentation

## 2015-09-20 ENCOUNTER — Encounter (HOSPITAL_COMMUNITY): Payer: Self-pay | Admitting: *Deleted

## 2015-11-15 DIAGNOSIS — F17200 Nicotine dependence, unspecified, uncomplicated: Secondary | ICD-10-CM | POA: Insufficient documentation

## 2015-12-11 ENCOUNTER — Telehealth: Payer: Self-pay | Admitting: Neurology

## 2015-12-11 DIAGNOSIS — G959 Disease of spinal cord, unspecified: Secondary | ICD-10-CM

## 2015-12-11 NOTE — Telephone Encounter (Signed)
Pt wants to know if we got the records for [facility] please call [phone]

## 2015-12-13 NOTE — Telephone Encounter (Signed)
I don't believe I saw them.  But I would like to get MRI of brain, cervical and thoracic spines with and without contrast to look for any changes in cord lesion or new lesions elsewhere.  Further recommendations and testing pending review of [facility] notes and MRI results.

## 2015-12-13 NOTE — Telephone Encounter (Signed)
Walking has worsened since last visit. Fell yesterday several time. Have you seen records from [facility]? I can refax Medical request if not. Did reschedule pt for 01/12/16 (next available). Any other suggestion for increased gait issue? Please advise.

## 2015-12-14 NOTE — Telephone Encounter (Signed)
Pt aware of MRI requests. Spoke w/ patient prefers [employer]. Orders placed. [facility] [phone]. Stated they are booked up but Jule Ser has openings for Monday. [facility] to call and discuss with patient and schedule. Will get authorization

## 2015-12-14 NOTE — Telephone Encounter (Signed)
Medical Release Request refaxed to [facility]

## 2015-12-23 ENCOUNTER — Ambulatory Visit (HOSPITAL_BASED_OUTPATIENT_CLINIC_OR_DEPARTMENT_OTHER): Payer: Medicaid Other

## 2015-12-30 ENCOUNTER — Ambulatory Visit (HOSPITAL_BASED_OUTPATIENT_CLINIC_OR_DEPARTMENT_OTHER)
Admission: RE | Admit: 2015-12-30 | Discharge: 2015-12-30 | Disposition: A | Discharge status: Home / Self Care | Payer: Medicaid Other | Source: Ambulatory Visit | Attending: Neurology | Admitting: Neurology

## 2015-12-30 DIAGNOSIS — M4802 Spinal stenosis, cervical region: Secondary | ICD-10-CM | POA: Insufficient documentation

## 2015-12-30 DIAGNOSIS — G959 Disease of spinal cord, unspecified: Secondary | ICD-10-CM | POA: Insufficient documentation

## 2015-12-30 DIAGNOSIS — E042 Nontoxic multinodular goiter: Secondary | ICD-10-CM | POA: Diagnosis not present

## 2015-12-30 MED ORDER — GADOBENATE DIMEGLUMINE 529 MG/ML IV SOLN
15.0000 mL | Freq: Once | INTRAVENOUS | Status: AC | PRN
  Administered 2015-12-30: 15 mL via INTRAVENOUS

## 2016-01-01 ENCOUNTER — Other Ambulatory Visit: Payer: Self-pay

## 2016-01-01 ENCOUNTER — Other Ambulatory Visit: Payer: Self-pay | Admitting: Diagnostic Radiology

## 2016-01-01 ENCOUNTER — Telehealth: Payer: Self-pay | Admitting: Neurology

## 2016-01-01 DIAGNOSIS — R9389 Abnormal findings on diagnostic imaging of other specified body structures: Secondary | ICD-10-CM

## 2016-01-01 MED ORDER — METHYLPREDNISOLONE SODIUM SUCC 1000 MG IJ SOLR: 1000.0000 mg | Freq: Every day | INTRAMUSCULAR | Status: AC

## 2016-01-01 NOTE — Telephone Encounter (Signed)
Pt is scheduled at shortstay for Wednesday. LP done ordered, [facility] aware. Pt at [facility] currently, will have labs drawn there.

## 2016-01-01 NOTE — Telephone Encounter (Signed)
I called and spoke with Bridget [name].  MRI of cervical spine shows some stenosis without cord abnormality.  However, thoracic spine shows a new acute lesion.  I would like to set her up for 3 days of IV Solu-Medrol 1000mg  daily.  I would also like to schedule her for LP under fluoroscopy.  I want to check CSF cell count, cytology, flow cytometry, protein, glucose, IgG index, oligoclonal bands, angiotensin converting enzyme, VDRL, culture.  They should save some fluid in case we need to add anything else.  I also want to check blood work, checking ANA, Sed Rate, CRP, angiotensin converting enzyme, and HIV.  I would like to see if we can get this done prior to her follow up on 01/12/16.  Also, there is a nodule seen on her thyroid.  We should get an ultrasound of her thyroid to evaluate further.

## 2016-01-01 NOTE — Addendum Note (Signed)
Addended by: Gerda Diss A on: 01/01/2016 11:47 AM   Modules accepted: Orders

## 2016-01-02 ENCOUNTER — Other Ambulatory Visit (HOSPITAL_COMMUNITY): Payer: Self-pay | Admitting: *Deleted

## 2016-01-03 ENCOUNTER — Ambulatory Visit (HOSPITAL_COMMUNITY)
Admission: RE | Admit: 2016-01-03 | Discharge: 2016-01-03 | Disposition: A | Discharge status: Home / Self Care | Payer: Medicaid Other | Source: Ambulatory Visit | Attending: Neurology | Admitting: Neurology

## 2016-01-03 DIAGNOSIS — R938 Abnormal findings on diagnostic imaging of other specified body structures: Secondary | ICD-10-CM | POA: Insufficient documentation

## 2016-01-03 MED ORDER — SODIUM CHLORIDE 0.9 % IV SOLN
1000.0000 mg | Freq: Every day | INTRAVENOUS | Status: DC
  Administered 2016-01-03: 1000 mg via INTRAVENOUS
  Filled 2016-01-03: qty 8

## 2016-01-04 ENCOUNTER — Other Ambulatory Visit: Payer: Medicaid Other

## 2016-01-04 ENCOUNTER — Ambulatory Visit (HOSPITAL_COMMUNITY)
Admission: RE | Admit: 2016-01-04 | Discharge: 2016-01-04 | Disposition: A | Discharge status: Home / Self Care | Payer: Medicaid Other | Source: Ambulatory Visit | Attending: Neurology | Admitting: Neurology

## 2016-01-04 DIAGNOSIS — R938 Abnormal findings on diagnostic imaging of other specified body structures: Secondary | ICD-10-CM | POA: Diagnosis present

## 2016-01-04 MED ORDER — SODIUM CHLORIDE 0.9 % IV SOLN
1000.0000 mg | Freq: Every day | INTRAVENOUS | Status: DC
  Administered 2016-01-04: 1000 mg via INTRAVENOUS
  Filled 2016-01-04: qty 8

## 2016-01-04 NOTE — Progress Notes (Signed)
IV placed on 01/03/16 site unremarkable; flushes well

## 2016-01-05 ENCOUNTER — Encounter (HOSPITAL_COMMUNITY)
Admission: RE | Admit: 2016-01-05 | Discharge: 2016-01-05 | Disposition: A | Discharge status: Home / Self Care | Payer: Medicaid Other | Source: Ambulatory Visit | Attending: Neurology | Admitting: Neurology

## 2016-01-05 DIAGNOSIS — R938 Abnormal findings on diagnostic imaging of other specified body structures: Secondary | ICD-10-CM | POA: Diagnosis not present

## 2016-01-05 MED ORDER — SODIUM CHLORIDE 0.9 % IV SOLN
1000.0000 mg | Freq: Every day | INTRAVENOUS | Status: AC
  Administered 2016-01-05: 1000 mg via INTRAVENOUS
  Filled 2016-01-05: qty 8

## 2016-01-05 NOTE — Progress Notes (Signed)
Saline lock right forearm; site unremarkable

## 2016-01-09 ENCOUNTER — Encounter: Payer: Self-pay | Admitting: Radiology

## 2016-01-09 ENCOUNTER — Other Ambulatory Visit (HOSPITAL_COMMUNITY)
Admission: RE | Admit: 2016-01-09 | Discharge: 2016-01-09 | Disposition: A | Discharge status: Home / Self Care | Payer: Medicaid Other | Source: Ambulatory Visit | Attending: Neurology | Admitting: Neurology

## 2016-01-09 ENCOUNTER — Ambulatory Visit
Admission: RE | Admit: 2016-01-09 | Discharge: 2016-01-09 | Disposition: A | Discharge status: Home / Self Care | Payer: Medicaid Other | Source: Ambulatory Visit | Attending: Neurology | Admitting: Neurology

## 2016-01-09 ENCOUNTER — Other Ambulatory Visit: Payer: Self-pay | Admitting: Radiology

## 2016-01-09 DIAGNOSIS — N393 Stress incontinence (female) (male): Secondary | ICD-10-CM | POA: Insufficient documentation

## 2016-01-09 DIAGNOSIS — G959 Disease of spinal cord, unspecified: Secondary | ICD-10-CM | POA: Insufficient documentation

## 2016-01-09 DIAGNOSIS — R9389 Abnormal findings on diagnostic imaging of other specified body structures: Secondary | ICD-10-CM

## 2016-01-09 DIAGNOSIS — G629 Polyneuropathy, unspecified: Secondary | ICD-10-CM | POA: Insufficient documentation

## 2016-01-09 DIAGNOSIS — R938 Abnormal findings on diagnostic imaging of other specified body structures: Secondary | ICD-10-CM | POA: Diagnosis present

## 2016-01-09 DIAGNOSIS — M4714 Other spondylosis with myelopathy, thoracic region: Secondary | ICD-10-CM | POA: Insufficient documentation

## 2016-01-09 DIAGNOSIS — D497 Neoplasm of unspecified behavior of endocrine glands and other parts of nervous system: Secondary | ICD-10-CM | POA: Insufficient documentation

## 2016-01-09 MED ORDER — PROMETHAZINE HCL 25 MG/ML IJ SOLN
25.0000 mg | Freq: Once | INTRAMUSCULAR | Status: AC
  Administered 2016-01-09: 25 mg via INTRAMUSCULAR

## 2016-01-09 MED ORDER — MEPERIDINE HCL 100 MG/ML IJ SOLN
75.0000 mg | Freq: Once | INTRAMUSCULAR | Status: AC
  Administered 2016-01-09: 75 mg via INTRAMUSCULAR

## 2016-01-09 NOTE — Discharge Instructions (Signed)

## 2016-01-09 NOTE — Progress Notes (Signed)
One lavender tube of whole blood drawn for LP labs from right [facility] space without difficulty; site unremarkable.  jkl

## 2016-01-09 NOTE — Progress Notes (Signed)
Two SST tubes of blood drawn for LP labs from right [facility] space; site unremarkable.

## 2016-01-12 ENCOUNTER — Ambulatory Visit: Payer: Medicaid Other | Admitting: Neurology

## 2016-01-12 ENCOUNTER — Telehealth: Payer: Self-pay

## 2016-01-12 DIAGNOSIS — R9389 Abnormal findings on diagnostic imaging of other specified body structures: Secondary | ICD-10-CM

## 2016-01-12 NOTE — Telephone Encounter (Signed)
TSH ordered, needed to get P.A. For imaging.

## 2016-01-16 ENCOUNTER — Telehealth: Payer: Self-pay | Admitting: Neurology

## 2016-01-16 DIAGNOSIS — R9389 Abnormal findings on diagnostic imaging of other specified body structures: Secondary | ICD-10-CM

## 2016-01-16 NOTE — Telephone Encounter (Signed)
Pt wants the results of the lumbar puncture please call [phone]

## 2016-01-16 NOTE — Telephone Encounter (Signed)
Left message on [facility] vm.

## 2016-01-16 NOTE — Telephone Encounter (Signed)
Called and spoke to [name]. (said like [place]) @ [employer] @ [phone]. States that specimen was lost, states it was just found today. Promised that last 2 tests would be ran today and results should be in by tomorrow. Left message for pt to let her know what was going on.

## 2016-01-16 NOTE — Telephone Encounter (Signed)
Pt called for result of LP. Please advise.

## 2016-01-16 NOTE — Telephone Encounter (Signed)
I still do not have the complete CSF results.  I need ACE, VDRL, oligoclonal bands and IgG index.

## 2016-01-18 ENCOUNTER — Other Ambulatory Visit (INDEPENDENT_AMBULATORY_CARE_PROVIDER_SITE_OTHER): Payer: Medicaid Other

## 2016-01-18 ENCOUNTER — Telehealth: Payer: Self-pay

## 2016-01-18 DIAGNOSIS — R9389 Abnormal findings on diagnostic imaging of other specified body structures: Secondary | ICD-10-CM

## 2016-01-18 DIAGNOSIS — R938 Abnormal findings on diagnostic imaging of other specified body structures: Secondary | ICD-10-CM

## 2016-01-18 LAB — TSH: TSH: 0.2 u[IU]/mL — ABNORMAL LOW (ref 0.35–4.50)

## 2016-01-18 NOTE — Telephone Encounter (Signed)
That is what we are testing for

## 2016-01-18 NOTE — Telephone Encounter (Signed)
The rest of the CSF labs came back. Everything is normal. I want to see what the NMO antibodies show (the new blood test we ordered).

## 2016-01-18 NOTE — Telephone Encounter (Signed)
Pt came and had bloodwork drawn today! Pt had a question about a "spot on her spine" that you called her about. I believe pt is taking about MRI (lumbar) from 3/25,   "1. 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 This waxing and waning course could indicate demyelinating disease, although the degree of cord edema is atypical and cervical and brain findings also are relatively lacking. Other noninfectious inflammatory disease such as Neurosarcoidosis is also a top consideration. Tumor or metastatic disease should not proceed in this manner."  Would like to know if further testing on this is needed.

## 2016-01-18 NOTE — Telephone Encounter (Signed)
Attempted to reach. No answer. VM left

## 2016-01-22 NOTE — Telephone Encounter (Signed)
It's different lesion.

## 2016-01-22 NOTE — Telephone Encounter (Signed)
Pt/Family would like to know if this is the same lesion that was seen on film by Dr. Arnoldo Morale at [facility]. MRI for review under Media from 02/23/15. States that came back as a "bruise" after all the testing. Please advise.

## 2016-01-24 LAB — NEUROMYELITIS OPTICA AQP4 AUTO AB

## 2016-01-26 ENCOUNTER — Telehealth: Payer: Self-pay | Admitting: Neurology

## 2016-01-26 DIAGNOSIS — G959 Disease of spinal cord, unspecified: Secondary | ICD-10-CM

## 2016-01-26 NOTE — Telephone Encounter (Signed)
Pt wants to see if the blood work is back yet please call [phone]

## 2016-01-29 NOTE — Telephone Encounter (Signed)
Referral placed. Pt aware. 

## 2016-01-29 NOTE — Telephone Encounter (Signed)
Pt called for most recent lab results, TSH and NMO. Please advise

## 2016-01-29 NOTE — Telephone Encounter (Signed)
The test for NMO is borderline.  TSH is mildly low.  I would check free T3 and T4.  I would like to refer her to Dr. Felecia Shelling for consultation regarding establishing a diagnosis.

## 2016-01-29 NOTE — Telephone Encounter (Signed)
Left message on machine for pt to return call to the office.  

## 2016-02-08 ENCOUNTER — Encounter: Payer: Self-pay | Admitting: *Deleted

## 2016-02-08 ENCOUNTER — Telehealth: Payer: Self-pay | Admitting: *Deleted

## 2016-02-08 NOTE — Telephone Encounter (Signed)
LMOM (identified vm) for pt. to call.  I need to talk to her about her upcoming appt. with RAS.Hilton Cork

## 2016-02-14 ENCOUNTER — Telehealth: Payer: Self-pay | Admitting: Neurology

## 2016-02-14 ENCOUNTER — Encounter: Payer: Self-pay | Admitting: Neurology

## 2016-02-14 ENCOUNTER — Ambulatory Visit (INDEPENDENT_AMBULATORY_CARE_PROVIDER_SITE_OTHER): Payer: Medicaid Other | Admitting: Neurology

## 2016-02-14 VITALS — BP 123/80 | HR 72 | Ht 62.0 in | Wt 163.5 lb

## 2016-02-14 DIAGNOSIS — R269 Unspecified abnormalities of gait and mobility: Secondary | ICD-10-CM | POA: Diagnosis not present

## 2016-02-14 DIAGNOSIS — N319 Neuromuscular dysfunction of bladder, unspecified: Secondary | ICD-10-CM

## 2016-02-14 DIAGNOSIS — H539 Unspecified visual disturbance: Secondary | ICD-10-CM | POA: Diagnosis not present

## 2016-02-14 DIAGNOSIS — G373 Acute transverse myelitis in demyelinating disease of central nervous system: Secondary | ICD-10-CM

## 2016-02-14 DIAGNOSIS — F411 Generalized anxiety disorder: Secondary | ICD-10-CM | POA: Diagnosis not present

## 2016-02-14 DIAGNOSIS — R208 Other disturbances of skin sensation: Secondary | ICD-10-CM

## 2016-02-14 MED ORDER — LAMOTRIGINE 25 MG PO TABS: ORAL_TABLET | ORAL | Status: DC

## 2016-02-14 MED ORDER — VARENICLINE TARTRATE 1 MG PO TABS: 1.0000 mg | ORAL_TABLET | Freq: Two times a day (BID) | ORAL | Status: DC

## 2016-02-14 MED ORDER — LAMOTRIGINE 100 MG PO TABS: 100.0000 mg | ORAL_TABLET | Freq: Two times a day (BID) | ORAL | Status: DC

## 2016-02-14 MED ORDER — VARENICLINE TARTRATE 0.5 MG PO TABS: ORAL_TABLET | ORAL | Status: DC

## 2016-02-14 NOTE — Telephone Encounter (Signed)
Patient called to advise, Medicaid will not cover medication to stop smoking, Dr. Felecia Shelling prescribed at visit today. Medicine costs over $400. Please call to advise.

## 2016-02-14 NOTE — Progress Notes (Signed)
[facility]  PATIENT: Bridget [name] DOB: 1965-12-30  REFERRING DOCTOR OR PCP:  Dr. Tomi Likens [facility]).   PCP is Heide Scales SOURCE: Patient, notes from Dr. Tomi Likens, MRI reports and images on PACS, lab reports  _________________________________   HISTORICAL  CHIEF COMPLAINT:  Chief Complaint  Patient presents with  . NP  Heide Scales MD  . Abnormal MRI  R/O Devic's , MS    HISTORY OF PRESENT ILLNESS:  I had the pleasure of seeing your patient, Bridget [name], at [facility] for neurologic consultation regarding her transverse myelitis.      In 2013, while in [place], she fell and had numbness below the waist and had severe leg weakness.  She did not have problems with her bladder.    She went to [facility]) in [place] and [place] airlifted to [facility] and was hospitalized x 7 days.  She had MRIs and an LP in [place] but does not know results.   She had PT and improved by discharge and did additional Rehab at home and at [facility].    She improved almost to baseline, walking independently but having problems going downhill or downstairs.      Last year, she had the gradual worsening in gait and had a more sudden change in July with much more difficulty with gait and numbness.    She saw Dr, Arnoldo Morale at [facility].     She had MRI's of the spine performed and saw Neurosurgery.   She was referred to Dr. Tomi Likens who saw her.   She improved a little bit so she did not need to use the walker as much.    She also had urinary incontinence.    Then around March this year, she had worsening of gait, numbness and bladder function.  A repeat MRI of the thoracic spine showed another enhancing focus, distinct from the first one.   She underwent a lumbar puncture and the CSF was reportedly normal.    Anti-NMO Ab was borderline.     She has dysesthesias but Lyrica had not helped.   Gabapentin caused cognitive  changes.     She feels her gait is poor due to decreased balance and strength is just mildly reduced.   She has some spasticity in her legs.   Arms are fine.  She reports problems with vision and has trouble reading.   Her right eye is worse and she reports poor color vision out of the right eye.    She has continued bladder dysfunction.    She reports frequency, urgency and near daily incontinence.  She wears Depends.    She gets some benefit from [facility].  I personally reviewed multiple MRIs. The MRI of the brain from 04/13/2015 and 12/30/2014 just showed 2-3 small T2/FLAIR hyperintense foci that are very nonspecific. MRI of the thoracic spine 02/23/2015 shows a lesion with edema at T2-T4.    MRI 09/20/2015 showed interval resolution of that focus. MRI of the thoracic spine 12/30/2015 shows a different enhancing lesion adjacent to T5-T6 associated with edema.  MRI of the cervical spine shows a normal spinal cord but she has significant foraminal narrowing at C5-C6 and C6-C7 due to degenerative changes.  I also reviewed some laboratory tests. The NMO antibody was borderline at 1.6.    She had a lumbar puncture for 01/09/2016. Although the actual results are not in the EMR there is a reference to the spinal fluid be normal on a telephone note.    REVIEW OF SYSTEMS: Constitutional: No fevers, chills, sweats, or change in appetite Eyes: No visual changes, double vision, eye pain Ear, nose and throat: No hearing loss, ear pain, nasal congestion, sore throat Cardiovascular: No chest pain, palpitations Respiratory: No shortness of breath at rest or with  exertion.   No wheezes GastrointestinaI: No nausea, vomiting, diarrhea, abdominal pain, fecal incontinence Genitourinary: No dysuria, urinary retention or frequency.  No nocturia. Musculoskeletal: No neck pain, back pain Integumentary: No rash, pruritus, skin lesions Neurological: as above Psychiatric: No depression at this time.  No anxiety Endocrine: No palpitations, diaphoresis, change in appetite, change in weigh or increased thirst Hematologic/Lymphatic: No anemia, purpura, petechiae. Allergic/Immunologic: No itchy/runny eyes, nasal congestion, recent allergic reactions, rashes  ALLERGIES: Allergies  Allergen Reactions  . Penicillins Anaphylaxis    Swelling/throat closes   . Amoxicillin Swelling    Eyes swelling  . Latex Other (See Comments)    Rash and blisters from powder in glove  . Shellfish-Derived Products Itching    HOME MEDICATIONS:  Current outpatient prescriptions:  .  baclofen (LIORESAL) 10 MG tablet, Take 10 mg by mouth at bedtime., Disp: , Rfl:  .  esomeprazole (NEXIUM) 20 MG capsule, Take 20 mg by mouth., Disp: , Rfl:  .  HYDROcodone-acetaminophen (NORCO/VICODIN) 5-325 MG per tablet, Take 1-2 tablets by mouth every 6 hours as needed for pain and/or cough., Disp: 7 tablet, Rfl: 0 .  ibuprofen (ADVIL,MOTRIN) 800 MG tablet, Take 800 mg by mouth., Disp: , Rfl:  .  meclizine (ANTIVERT) 25 MG tablet, TAKE 1 TABLET BY MOUTH 4 TIMES A DAY FOR 7 DAYS, Disp: , Rfl: 0 .  oxyCODONE (OXY IR/ROXICODONE) 5 MG immediate release tablet, TAKE 1 TO 3 TABLETS BY MOUTH EVERY 4 HOURS AS NEEDED FOR PAIN, Disp: , Rfl: 0 .  pregabalin (LYRICA) 25 MG capsule, Take 25 mg by mouth 3 (three) times daily., Disp: , Rfl:  .  promethazine (PHENERGAN) 25 MG tablet, Take 25 mg by mouth every 6 (six) hours as needed for nausea or vomiting., Disp: , Rfl:  .  sertraline (ZOLOFT) 50 MG tablet, Take 50 mg by mouth., Disp: , Rfl:  .  topiramate (TOPAMAX) 25 MG tablet, Take 25 mg by mouth at bedtime.,  Disp: , Rfl:  .  traMADol (ULTRAM) 50 MG tablet, Take by mouth every 6 (six) hours as needed., Disp: , Rfl:   PAST MEDICAL HISTORY: Past Medical History  Diagnosis Date  . Back pain   . Spinal stenosis   . Neuropathy ([place])     PAST SURGICAL HISTORY: Past Surgical History  Procedure Laterality Date  . Tubal ligation    . Cholecystectomy    .  Breast lumpectomy Right     benign    FAMILY HISTORY: Family History  Problem Relation Age of Onset  . Kidney cancer Mother     SOCIAL HISTORY:  Social History   Social History  . Marital Status: Divorced    Spouse Name: N/A  . Number of Children: N/A  . Years of Education: N/A   Occupational History  . Not on file.   Social History Main Topics  . Smoking status: Current Every Day Smoker -- 0.50 packs/day    Types: Cigarettes  . Smokeless tobacco: Not on file  . Alcohol Use: No  . Drug Use: No  . Sexual Activity: Not on file   Other Topics Concern  . Not on file   Social History Narrative   ** Merged History Encounter **   Lives with boyfriend, Adriana Mccallum.  Education 12th grade diploma.  Unemployed.  2 children.     2 cups coffee daily.         PHYSICAL EXAM  Filed Vitals:   02/14/16 1309  BP: 123/80  Pulse: 72  Height: 5\' 2"  (1.575 m)  Weight: 163 lb 8 oz (74.163 kg)    Body mass index is 29.9 kg/(m^2).   General: The patient is well-developed and well-nourished and in no acute distress  Eyes:  Funduscopic exam shows normal optic discs and retinal vessels.   VA is 20/40 OD and 20/30 OS.  Neck: The neck is supple, no carotid bruits are noted.  The neck is nontender.  Cardiovascular: The heart has a regular rate and rhythm with a normal S1 and S2. There were no murmurs, gallops or rubs. Lungs are clear to auscultation.  Skin: Extremities are without significant edema.  Musculoskeletal:  Back is nontender  Neurologic Exam  Mental status: The patient is alert and oriented x 3 at the time of the  examination. The patient has apparent normal recent and remote memory, with an apparently normal attention span and concentration ability.   Speech is normal.  Cranial nerves: Extraocular movements are full. Pupils are equal, round, and reactive to light and accomodation.  Visual fields are full. She reports decrease color saturation out of the right eye. Facial symmetry is present. There is good facial sensation to soft touch bilaterally.Facial strength is normal.  Trapezius and sternocleidomastoid strength is normal. No dysarthria is noted.  The tongue is midline, and the patient has symmetric elevation of the soft palate. No obvious hearing deficits are noted.  Motor:  Muscle bulk is normal.   Tone is normal. Strength is  5 / 5 in all 4 extremities.   Sensory: Sensory testing is intact to pinprick, soft touch and vibration sensation in arms,  she has a mid thoracic sensory level sudden transition to hyperesthesia and is hyperesthetic to aperture and touch in the legs.   Vibration sensation was fairly normal in the legs..  Coordination: Cerebellar testing reveals good finger-nose-finger and reduced heel-to-shin bilaterally.  Gait and station: Station is normal.   Gait is mildly spastic and widel. She cannot tandeml. Romberg is positive.   Reflexes: Deep tendon reflexes are symmetric and normal in arms but increased in legs with spread at the knees.   No sustained clonus.     Plantar responses are flexor.    DIAGNOSTIC DATA (LABS, IMAGING, TESTING) - I reviewed patient records, labs, notes, testing and imaging myself where available.   Lab Results  Component Value Date   TSH 0.20* 01/18/2016  ASSESSMENT AND PLAN  Transverse myelitis ([place]) - Plan: Visual evoked potential test, Neuromyelitis optica autoab, IgG, Sedimentation rate, ANA w/Reflex, Angiotensin converting enzyme  Bladder neurogenesis  Gait disturbance  Dysesthesia  Anxiety state  Visual disturbance - Plan:  Visual evoked potential test, Neuromyelitis optica autoab, IgG, Sedimentation rate, ANA w/Reflex, Angiotensin converting enzyme   In summary, Jilliane Camano is a 50 year old woman who has had 2 episodes of transverse myelitis and has sequela of hyperesthesia below the midthoracic level, hyperreflexia and spastic gait.   She also has mild visual changes. The MRI of the brain is fairly normal with just a couple small nonspecific foci. I discussed with her and her fianc that I am most concerned about the possibility of Devic's disease (neuromyelitis optica) and that is similar to MS but responds differently to medications.    To try to pin down the diagnosis more, I will recheck the NMO antibody and also get a visual evoked potential. If either of these tests is abnormal, then Devic's is more likely diagnosis and I will begin therapy. If both tests are normal, the diagnosis is less certain. I will also some vasculitis labs.  To help with the allodynia I started lamotrigine and we will titrate that up to 100 mg by mouth twice a day.  She will return to see me in 2 months or sooner if there are new or worsening neurologic symptoms.   Josten Warmuth A. Felecia Shelling, MD, PhD [id number], [id number] PM Certified in Neurology, Clinical Neurophysiology, Sleep Medicine, Pain Medicine and Neuroimaging  [facility] [address] [place], [place] [address] [phone]

## 2016-02-14 NOTE — Patient Instructions (Signed)
The pharmacy has the prescription of lamotrigine 25 mg. Please take it as follows: For 5 days take 1 pill once a day. Then for 5 days, take 1 pill twice a day. Then for 5 days take 1 pill in the morning and 2 at bedtime or evening. Then for 5 days take 2 pills twice a day.  I have also give you a paper prescription for lamotrigine 100 mg tablets after the first 4 weeks you should take 100 mg twice a day.  Most people tolerate lamotrigine very well. Some will get a rash. If you do get a significant rash stop the medicine immediately and let us know.

## 2016-02-15 NOTE — Telephone Encounter (Signed)
Per [place] Medicaid's pharmacy formulary, Chantix is a preferred, covered drug.  Called pharmacy and the issue has been resolved.  Called patient to make her aware.

## 2016-02-16 LAB — ANGIOTENSIN CONVERTING ENZYME: Angio Convert Enzyme: 27 U/L (ref 14–82)

## 2016-02-16 LAB — NEUROMYELITIS OPTICA AUTOAB, IGG: NMO IgG Autoantibodies: <1.5 U/mL (ref 0.0–3.0)

## 2016-02-16 LAB — ANA W/REFLEX: Anti Nuclear Antibody(ANA): NEGATIVE

## 2016-02-16 LAB — SEDIMENTATION RATE: Sed Rate: 3 mm/hr (ref 0–32)

## 2016-02-19 ENCOUNTER — Telehealth: Payer: Self-pay | Admitting: *Deleted

## 2016-02-19 NOTE — Telephone Encounter (Signed)
I have spoken with Bridget [name] this afternoon and per RAS, advised that her lab work looks normal.  She verbalized understanding of same/fim

## 2016-02-19 NOTE — Telephone Encounter (Signed)
-----   Message from Britt Bottom, MD sent at 02/19/2016 12:24 PM EDT ----- Please let her know that lab work looks normal.

## 2016-03-06 ENCOUNTER — Telehealth: Payer: Self-pay | Admitting: Neurology

## 2016-03-06 NOTE — Telephone Encounter (Signed)
I have spoken with Bridget [name] this afternoon.  She sts. she received a message from a doctor, not sure which doctor b/c she hasn't listened to the whole message yet.  I do not see any record of a call from this office and have advised she listen to message in its entirety.  She is agreeable/fim

## 2016-03-06 NOTE — Telephone Encounter (Signed)
Message For: OFFICE               Taken 30-MAY-17 at  3:46PM by [facility] ------------------------------------------------------------  Bridget [name]                CID  [id number]   Patient  SAME                  Pt's Dr  Felecia Shelling         Area Code  336  Phone#  [phone]     [place].     [id number] TEST NEXT WEEK                            Disp:Y/N  N  If Y = C/B If No Response In 30minutes  ============================================================

## 2016-03-06 NOTE — Telephone Encounter (Signed)
Message For: [facility]                  Taken 31-MAY-17 at 11:25AM by LSJ ------------------------------------------------------------  Marin Shutter [facility]                 CID  [id number]   Patient  SAME                  Pt's Dr  Felecia Shelling         Area Code  336  Phone#  [place]                                                                                        Disp:Y/N  N  If Y = C/B If No Response In 29minutes

## 2016-03-07 ENCOUNTER — Telehealth: Payer: Self-pay | Admitting: *Deleted

## 2016-03-07 NOTE — Telephone Encounter (Signed)
Message For: OFFICE               Taken  1-JUN-17 at  1:28PM by DEF ------------------------------------------------------------ Bridget [name] [facility]                CID [id number]  Patient SAME                 Pt's Dr Felecia Shelling        Area Code 336 Phone# 27 [place] 1 7 [place] 6/7 APPT                                                                    Disp:Y/N N If Y = C/B If No Response In 31minutes ============================================================

## 2016-03-07 NOTE — Telephone Encounter (Signed)
Pt returned Faith's call. She said Faith left a msg for her about explaining what procedure she was going to have next week. Operator relayed information regarding VEP, pt understood and was thankful. She does not need Faith to call.

## 2016-03-07 NOTE — Telephone Encounter (Signed)
I have spoken with Angie this afternoon.  She has no further questions/fim

## 2016-03-07 NOTE — Telephone Encounter (Signed)
[place].  I have not called her./fim

## 2016-03-14 ENCOUNTER — Encounter: Payer: Self-pay | Admitting: Neurology

## 2016-03-18 ENCOUNTER — Other Ambulatory Visit: Payer: Self-pay | Admitting: Neurology

## 2016-03-19 ENCOUNTER — Telehealth: Payer: Self-pay | Admitting: *Deleted

## 2016-03-19 NOTE — Telephone Encounter (Signed)
Pt records, faxed to [facility] on 03/19/16.

## 2016-04-11 ENCOUNTER — Encounter: Payer: Self-pay | Admitting: Neurology

## 2016-04-17 ENCOUNTER — Ambulatory Visit: Payer: Medicaid Other | Admitting: Neurology

## 2016-05-02 ENCOUNTER — Ambulatory Visit (INDEPENDENT_AMBULATORY_CARE_PROVIDER_SITE_OTHER): Payer: Medicaid Other | Admitting: Neurology

## 2016-05-02 ENCOUNTER — Encounter: Payer: Self-pay | Admitting: Neurology

## 2016-05-02 ENCOUNTER — Ambulatory Visit: Payer: Medicaid Other | Admitting: Neurology

## 2016-05-02 VITALS — BP 118/86 | HR 70 | Resp 16 | Ht 62.0 in | Wt 165.5 lb

## 2016-05-02 DIAGNOSIS — N319 Neuromuscular dysfunction of bladder, unspecified: Secondary | ICD-10-CM | POA: Diagnosis not present

## 2016-05-02 DIAGNOSIS — G373 Acute transverse myelitis in demyelinating disease of central nervous system: Secondary | ICD-10-CM

## 2016-05-02 DIAGNOSIS — R208 Other disturbances of skin sensation: Secondary | ICD-10-CM

## 2016-05-02 DIAGNOSIS — R269 Unspecified abnormalities of gait and mobility: Secondary | ICD-10-CM | POA: Diagnosis not present

## 2016-05-02 MED ORDER — SULFAMETHOXAZOLE-TRIMETHOPRIM 400-80 MG PO TABS: 1.0000 | ORAL_TABLET | Freq: Two times a day (BID) | ORAL | 0 refills | Status: DC

## 2016-05-02 MED ORDER — OXYBUTYNIN CHLORIDE ER 10 MG PO TB24: 10.0000 mg | ORAL_TABLET | Freq: Every day | ORAL | 11 refills | Status: DC

## 2016-05-02 MED ORDER — BACLOFEN 10 MG PO TABS: 10.0000 mg | ORAL_TABLET | Freq: Three times a day (TID) | ORAL | 5 refills | Status: DC

## 2016-05-02 NOTE — Progress Notes (Signed)
[facility]  PATIENT: Bridget [name] DOB: [birth year]  REFERRING DOCTOR OR PCP:  Dr. Tomi Likens [facility]).   PCP is Heide Scales SOURCE: Patient, notes from Dr. Tomi Likens, MRI reports and images on PACS, lab reports  _________________________________   HISTORICAL  CHIEF COMPLAINT:  Chief Complaint  Patient presents with  . History of Transverse Myelitis    Bridget [name] is ambulatory with a rolling wallker.  Today she c/o pain/edema left foot/ankle onset one month ago.  Denies known injury.  Sts. pcp told her edema was due to salt intake./fim    HISTORY OF PRESENT ILLNESS:  Bridget [name]) Is a 50 year old woman who has had 2 episodes of transverse myelitis.    Dysesthesia:   She gets a sharp pain in her left leg radiating up to her back at times.   She has dysesthesias but Lyrica had not helped.   Gabapentin caused cognitive changes.         Gait/strength/sensation:   She notes left greater than right leg weakness and spasticity. There is decreased sensation in the left leg and it is clumsy.       She feels her gait is poor due more to decreased balance as strength is just mildly reduced.   She has some spasticity in her legs.   Arms are fine.  Vision:    She reports problems with vision and has trouble reading.   Her right eye is worse and she reports poor color vision out of the right eye.    Bladder:    She has continued bladder dysfunction.    She reports frequency, urgency and daily incontinence, multiple times a day.  She wears Depends.    She had some benefit from a bladder medication.   Urine is cloudy much of the time.  History of transverse  myelitis/possible MS:   In 2013, while in [place], she fell and had numbness below the waist and had severe leg weakness.  She did not have problems with her bladder.    She went to [facility]) in [place] and [place] airlifted to [facility] and was hospitalized x 7 days.  She had MRIs and an LP in [place] but does not know results.   She had PT and improved by discharge and did additional Rehab at home and at [facility].    She improved almost to baseline, walking independently but having problems going downhill or downstairs.    In 2016, she had the gradual worsening in gait and had a more sudden change in July with much more difficulty with gait and numbness.    She saw Dr, Arnoldo Morale at [facility].     She had MRI's of the spine performed and saw Neurosurgery.   She was referred to Dr. Tomi Likens who saw her.   She improved a little bit so she did not need to use the walker as much.    She also had urinary incontinence.     In March 2017,  she had worsening of gait, numbness and bladder function.  A repeat MRI of the thoracic spine showed another enhancing focus, distinct from the first one.   She underwent a lumbar puncture and the CSF was reportedly normal.    Anti-NMO Ab was borderline.    I saw her a few weeks later.   Repeat NMO-Ab was negative.      I have reviewed multiple MRIs. The MRI of the brain from 04/13/2015 and 12/30/2014 just showed 2-3 small T2/FLAIR hyperintense foci that are very nonspecific. MRI of the thoracic spine 02/23/2015 shows a lesion with edema at T2-T4.    MRI 09/20/2015 showed interval resolution of that focus. MRI of the thoracic spine 12/30/2015 shows a different enhancing lesion adjacent to T5-T6 associated with edema.  MRI of the cervical spine shows a normal spinal cord but she has significant foraminal narrowing at C5-C6 and C6-C7 due to degenerative changes.    I have also reviewed some laboratory tests. The NMO antibody was  borderline at 1.6 initially but then was < 1.5 at second check.      She had a lumbar puncture for 01/09/2016. Although the actual results are not in the EMR there is a reference to the spinal fluid being normal on a telephone note.    REVIEW OF SYSTEMS: Constitutional: No fevers, chills, sweats, or change in appetite Eyes: No visual changes, double vision, eye pain Ear, nose and throat: No hearing loss, ear pain, nasal congestion, sore throat Cardiovascular: No chest pain, palpitations Respiratory: No shortness of breath at rest or with exertion.   No wheezes GastrointestinaI: No nausea, vomiting, diarrhea, abdominal pain, fecal incontinence Genitourinary: No dysuria, urinary retention or frequency.  No nocturia. Musculoskeletal: No neck pain, back pain Integumentary: No rash, pruritus, skin lesions Neurological: as above Psychiatric: No depression at this time.  No anxiety Endocrine: No palpitations, diaphoresis, change in appetite, change in weigh or increased thirst Hematologic/Lymphatic: No anemia, purpura, petechiae. Allergic/Immunologic: No itchy/runny eyes, nasal congestion, recent allergic reactions, rashes  ALLERGIES: Allergies  Allergen Reactions  . Penicillins Anaphylaxis    Swelling/throat closes   . Amoxicillin Swelling    Eyes swelling  . Latex Other (See Comments)    Rash and blisters from powder in glove  . Shellfish-Derived Products Itching  HOME MEDICATIONS:  Current Outpatient Prescriptions:  .  baclofen (LIORESAL) 10 MG tablet, Take 1 tablet (10 mg total) by mouth 3 (three) times daily., Disp: 90 each, Rfl: 5 .  esomeprazole (NEXIUM) 20 MG capsule, Take 20 mg by mouth., Disp: , Rfl:  .  HYDROcodone-acetaminophen (NORCO/VICODIN) 5-325 MG per tablet, Take 1-2 tablets by mouth every 6 hours as needed for pain and/or cough., Disp: 7 tablet, Rfl: 0 .  ibuprofen (ADVIL,MOTRIN) 800 MG tablet, Take 800 mg by mouth., Disp: , Rfl:  .  meclizine (ANTIVERT) 25 MG  tablet, TAKE 1 TABLET BY MOUTH 4 TIMES A DAY FOR 7 DAYS, Disp: , Rfl: 0 .  oxybutynin (DITROPAN XL) 10 MG 24 hr tablet, Take 1 tablet (10 mg total) by mouth at bedtime., Disp: 30 tablet, Rfl: 11 .  oxyCODONE (OXY IR/ROXICODONE) 5 MG immediate release tablet, TAKE 1 TO 3 TABLETS BY MOUTH EVERY 4 HOURS AS NEEDED FOR PAIN, Disp: , Rfl: 0 .  promethazine (PHENERGAN) 25 MG tablet, Take 25 mg by mouth every 6 (six) hours as needed for nausea or vomiting., Disp: , Rfl:  .  sertraline (ZOLOFT) 50 MG tablet, Take 50 mg by mouth., Disp: , Rfl:  .  sulfamethoxazole-trimethoprim (BACTRIM) 400-80 MG tablet, Take 1 tablet by mouth 2 (two) times daily., Disp: 14 tablet, Rfl: 0 .  traMADol (ULTRAM) 50 MG tablet, Take by mouth every 6 (six) hours as needed., Disp: , Rfl:  .  varenicline (CHANTIX CONTINUING MONTH PAK) 1 MG tablet, Take 1 tablet (1 mg total) by mouth 2 (two) times daily., Disp: 60 tablet, Rfl: 2 .  varenicline (CHANTIX) 0.5 MG tablet, 0.5 mg po bid for the first two weeks, then 1 mg pill po bid x 2 months, Disp: 30 tablet, Rfl: 0  PAST MEDICAL HISTORY: Past Medical History:  Diagnosis Date  . Back pain   . Neuropathy ([place])   . Spinal stenosis     PAST SURGICAL HISTORY: Past Surgical History:  Procedure Laterality Date  . BREAST LUMPECTOMY Right    benign  . CHOLECYSTECTOMY    . TUBAL LIGATION      FAMILY HISTORY: Family History  Problem Relation Age of Onset  . Kidney cancer Mother     SOCIAL HISTORY:  Social History   Social History  . Marital status: Divorced    Spouse name: N/A  . Number of children: N/A  . Years of education: N/A   Occupational History  . Not on file.   Social History Main Topics  . Smoking status: Current Every Day Smoker    Packs/day: 0.50    Types: Cigarettes  . Smokeless tobacco: Not on file  . Alcohol use No  . Drug use: No  . Sexual activity: Not on file   Other Topics Concern  . Not on file   Social History Narrative   ** Merged  History Encounter **   Lives with boyfriend, Adriana Mccallum.  Education 12th grade diploma.  Unemployed.  2 children.     2 cups coffee daily.         PHYSICAL EXAM  Vitals:   05/02/16 1127  BP: 118/86  Pulse: 70  Resp: 16  Weight: 165 lb 8 oz (75.1 kg)  Height: 5\' 2"  (1.575 m)    Body mass index is 30.27 kg/m.   General: The patient is well-developed and well-nourished and in no acute distress  Musculoskeletal:  Back is nontender  Neurologic Exam  Mental status: The patient  is alert and oriented x 3 at the time of the examination. The patient has apparent normal recent and remote memory, with an apparently normal attention span and concentration ability.   Speech is normal.  Cranial nerves: Extraocular movements are full. Pupils are equal, round, and reactive to light and accomodation.  She reports symmetric color vision  Facial symmetry is present. There is good facial sensation to soft touch bilaterally.Facial strength is normal.  Trapezius and sternocleidomastoid strength is normal. No dysarthria is noted.  The tongue is midline, and the patient has symmetric elevation of the soft palate. No obvious hearing deficits are noted.  Motor:  Muscle bulk is normal.   Tone is increased in left > right legs.   Strength is  4+ / 5 in legs, slightly worse on the left.     Sensory: Sensory testing is intact to pinprick, soft touch and vibration sensation in arms.  She has a mid thoracic sensory level with a transition to hyperesthesia and is hyperesthetic to temperature and touch in the legs, worse on the left.   Vibration sensation was fairly normal in the legs..  Coordination: Cerebellar testing reveals good finger-nose-finger and reduced heel-to-shin bilaterally.  Gait and station: Station is normal.   Gait is mildly spastic and wide. She cannot tandem.   Romberg is positive.   Reflexes: Deep tendon reflexes are symmetric and normal in arms but increased in legs with spread at the  knees and 2-3 beats nonsustained ankle clonus.        DIAGNOSTIC DATA (LABS, IMAGING, TESTING) - I reviewed patient records, labs, notes, testing and imaging myself where available.   Lab Results  Component Value Date   TSH 0.20 (L) 01/18/2016       ASSESSMENT AND PLAN  Transverse myelitis ([place]) - Plan: MR Brain W Wo Contrast  Bladder neurogenesis - Plan: Urinalysis, Routine w reflex microscopic (not at [facility]), Urine culture  Gait disturbance - Plan: MR Brain W Wo Contrast  Dysesthesia - Plan: MR Brain W Wo Contrast   1.   UA and urine culture for suspected UTI. I will have her take Bactrim and changed to a different antibiotic if indicated. 2.   I will check an MRI of the brain to make sure that there has not been subclinical progression. If present, then her most likely diagnosis is multiple sclerosis. The lightest of multiple sclerosis. Hearing of months and we will also need to consider MRI of the spine 3.   She will return to see me in 4 months or sooner if there are new or worsening neurologic symptoms.   Richard A. Felecia Shelling, MD, PhD [id number], [id number] PM Certified in Neurology, Clinical Neurophysiology, Sleep Medicine, Pain Medicine and Neuroimaging  [facility] [address] [place], [place] [address] [phone]

## 2016-05-03 LAB — URINALYSIS, ROUTINE W REFLEX MICROSCOPIC
Bilirubin, UA: NEGATIVE
Glucose, UA: NEGATIVE
Ketones, UA: NEGATIVE
Nitrite, UA: POSITIVE — AB
Protein, UA: NEGATIVE
RBC, UA: NEGATIVE
Specific Gravity, UA: 1.009 (ref 1.005–1.030)
Urobilinogen, Ur: 0.2 mg/dL (ref 0.2–1.0)
pH, UA: 6.5 (ref 5.0–7.5)

## 2016-05-03 LAB — MICROSCOPIC EXAMINATION: Casts: NONE SEEN /lpf

## 2016-05-04 LAB — URINE CULTURE

## 2016-05-06 ENCOUNTER — Telehealth: Payer: Self-pay | Admitting: *Deleted

## 2016-05-06 ENCOUNTER — Other Ambulatory Visit: Payer: Self-pay | Admitting: Neurology

## 2016-05-06 MED ORDER — NITROFURANTOIN MONOHYD MACRO 100 MG PO CAPS: 100.0000 mg | ORAL_CAPSULE | Freq: Two times a day (BID) | ORAL | 0 refills | Status: DC

## 2016-05-06 NOTE — Telephone Encounter (Signed)
LVM for patient to call office. Advised we received PA request for oxybutynin and wondering if she has tried/failed previous bladder meds. Gave [facility] phone number.

## 2016-05-07 ENCOUNTER — Encounter: Payer: Self-pay | Admitting: *Deleted

## 2016-05-07 NOTE — Telephone Encounter (Signed)
Pt called in and says she is currently taking sulfamethoxazole-trimethoprim (BACTRIM) 400-80 MG tablet but is still requesting the other medication. Please call and advise [phone]

## 2016-05-07 NOTE — Telephone Encounter (Signed)
Gave PA information to Adele Dan, RN

## 2016-05-07 NOTE — Telephone Encounter (Signed)
Pt returned call - she has tried Veterinary surgeon.

## 2016-05-08 ENCOUNTER — Other Ambulatory Visit: Payer: Self-pay | Admitting: *Deleted

## 2016-05-08 MED ORDER — OXYBUTYNIN CHLORIDE 5 MG PO TABS: 5.0000 mg | ORAL_TABLET | Freq: Two times a day (BID) | ORAL | 11 refills | Status: DC

## 2016-05-14 ENCOUNTER — Ambulatory Visit
Admission: RE | Admit: 2016-05-14 | Discharge: 2016-05-14 | Disposition: A | Discharge status: Home / Self Care | Payer: Medicaid Other | Source: Ambulatory Visit | Attending: Neurology | Admitting: Neurology

## 2016-05-14 DIAGNOSIS — G373 Acute transverse myelitis in demyelinating disease of central nervous system: Secondary | ICD-10-CM

## 2016-05-14 DIAGNOSIS — R208 Other disturbances of skin sensation: Secondary | ICD-10-CM

## 2016-05-14 DIAGNOSIS — R269 Unspecified abnormalities of gait and mobility: Secondary | ICD-10-CM

## 2016-05-14 MED ORDER — GADOBENATE DIMEGLUMINE 529 MG/ML IV SOLN
15.0000 mL | Freq: Once | INTRAVENOUS | Status: AC | PRN
  Administered 2016-05-14: 15 mL via INTRAVENOUS

## 2016-05-17 ENCOUNTER — Telehealth: Payer: Self-pay | Admitting: *Deleted

## 2016-05-17 NOTE — Telephone Encounter (Signed)
-----   Message from Britt Bottom, MD sent at 05/16/2016  1:13 PM EDT ----- Please let her know that the MRI does not show any new changes

## 2016-05-17 NOTE — Telephone Encounter (Signed)
LMOM (identified vm) that per RAS, MRI shows nothing new.  She does not need to return this call unless she has questions/fim

## 2016-05-18 ENCOUNTER — Other Ambulatory Visit: Payer: Self-pay | Admitting: Neurology

## 2016-05-21 ENCOUNTER — Telehealth: Payer: Self-pay | Admitting: Neurology

## 2016-05-21 NOTE — Telephone Encounter (Signed)
Pt called in to make appt for swelling in her feet. I was not sure if she was appropriate for same day appt so I skyped Faith. Faith took call from the pt.

## 2016-05-21 NOTE — Telephone Encounter (Signed)
I spoke with Bridget [name] earlier this morning.  She c/o waking with nausea/vomiting during the night.  She also c/o edema bilat feet, left worse than right.  Hx. of fx. left ankle.  Denies chest pain, shortness of breath. After speaking with RAS, I advised [name] to f/u with pcp for eval of edema in feet, n/v.  She verbalized understanding of same, is agreeable with this plan/fim

## 2016-05-28 ENCOUNTER — Telehealth: Payer: Self-pay | Admitting: Neurology

## 2016-05-28 NOTE — Telephone Encounter (Signed)
Noted/fim 

## 2016-05-28 NOTE — Telephone Encounter (Signed)
Patient called to advise, Dr. Felecia Shelling prescribed nitrofurantoin, macrocrystal-monohydrate, (MACROBID) 100 MG capsule, went to PCP on Friday August 18th, they found bacteria in bladder, per PCP instructions, stopped MACROBID, started antibiotic shots (can't remember name), took one shot yesterday, will take one today and one tomorrow, will repeat urine culture on Friday.

## 2016-06-21 ENCOUNTER — Telehealth: Payer: Self-pay | Admitting: Neurology

## 2016-06-21 NOTE — Telephone Encounter (Signed)
Pt advised to call Dr. Felecia Shelling.

## 2016-06-21 NOTE — Telephone Encounter (Signed)
Bridget [name] 01/02/1966. She was calling regarding her MRI results. Her # is [phone]. Thank you

## 2016-06-27 ENCOUNTER — Telehealth: Payer: Self-pay | Admitting: Neurology

## 2016-06-27 NOTE — Telephone Encounter (Signed)
Pt called requesting results on MRI. Please call and advise

## 2016-06-28 NOTE — Telephone Encounter (Signed)
I have spoken with Abha this morning and explained that per VP, he compared 05-15-16 MRI with the one she had in March 2017.  Lesions that were present on the March MRI appear stable, and there do not appear to be any definite new lesions on the August MRI.  She verbalized understanding of same/fim

## 2016-09-04 ENCOUNTER — Ambulatory Visit: Payer: Medicaid Other | Admitting: Neurology

## 2016-09-09 ENCOUNTER — Encounter: Payer: Self-pay | Admitting: Neurology

## 2016-10-18 NOTE — Telephone Encounter (Signed)
Complete

## 2016-10-24 ENCOUNTER — Ambulatory Visit: Payer: Self-pay | Admitting: Neurology

## 2016-11-26 ENCOUNTER — Ambulatory Visit: Payer: Medicaid Other | Admitting: Neurology

## 2016-12-11 ENCOUNTER — Ambulatory Visit (INDEPENDENT_AMBULATORY_CARE_PROVIDER_SITE_OTHER): Payer: Medicare HMO | Admitting: Neurology

## 2016-12-11 ENCOUNTER — Encounter: Payer: Self-pay | Admitting: Neurology

## 2016-12-11 VITALS — BP 118/82 | HR 74 | Resp 16 | Ht 62.0 in | Wt 164.0 lb

## 2016-12-11 DIAGNOSIS — R208 Other disturbances of skin sensation: Secondary | ICD-10-CM | POA: Diagnosis not present

## 2016-12-11 DIAGNOSIS — G373 Acute transverse myelitis in demyelinating disease of central nervous system: Secondary | ICD-10-CM | POA: Diagnosis not present

## 2016-12-11 DIAGNOSIS — R269 Unspecified abnormalities of gait and mobility: Secondary | ICD-10-CM | POA: Diagnosis not present

## 2016-12-11 DIAGNOSIS — N319 Neuromuscular dysfunction of bladder, unspecified: Secondary | ICD-10-CM | POA: Diagnosis not present

## 2016-12-11 DIAGNOSIS — F411 Generalized anxiety disorder: Secondary | ICD-10-CM

## 2016-12-11 MED ORDER — BACLOFEN 10 MG PO TABS: 10.0000 mg | ORAL_TABLET | Freq: Three times a day (TID) | ORAL | 5 refills | Status: DC

## 2016-12-11 MED ORDER — DIAZEPAM 5 MG PO TABS: 5.0000 mg | ORAL_TABLET | Freq: Three times a day (TID) | ORAL | 5 refills | Status: DC | PRN

## 2016-12-11 MED ORDER — OXCARBAZEPINE 300 MG PO TABS: 300.0000 mg | ORAL_TABLET | Freq: Two times a day (BID) | ORAL | 5 refills | Status: DC

## 2016-12-11 NOTE — Progress Notes (Signed)
[facility]  PATIENT: Bridget [name] DOB: [birth year]  REFERRING DOCTOR OR PCP:  Dr. Tomi Likens [facility]).   PCP is Heide Scales SOURCE: Patient, notes from Dr. Tomi Likens, MRI reports and images on PACS, lab reports  _________________________________   HISTORICAL  CHIEF COMPLAINT:  Chief Complaint  Patient presents with  . Transverse Myelitis    Sts. she is having more pain in her lbp and legs.  Sts. for the last couple of weeks she's had a knot right lower back.  Seen in the ER for same and sts. x-rays were ok/fim    HISTORY OF PRESENT ILLNESS:  Bridget [name]) Is a 50 year old woman who has had 2 episodes of transverse myelitis in 2016 and 2017  TM:   She had transverse myelitis twice, in 2016 and in 2017. MRI of the brain shows a few foci that are nonspecific and have been stable. Initial neuromyelitis optica antibody was 1.6 (normal is 1.5 and below)  but subsequent antibody was negative. She does not think that she has had any new symptoms though she is experiencing more pain from her old events. I reviewed images from her MRI of the brain dated 05/14/2016 showed a few small subcortical and periventricular white matter foci but no change when compared to the previous MRI from 2017. The foci are considered to be nonspecific.  LBP/spasms/jerkingShe gets pain in her lower back, worse if she sits a while or lays flat.  She gets jerking in her legs and often falls.   The jerking is throughout the day but worse if she is drowsy.   A couple weeks ago she went to [facility]. An x-ray of the back just showed minimal degenerative changes at L3-L4 and L4-L5. She has a known disc protrusion at L4-L5 on a 2016 MRI but no definite nerve root compression.  Dysesthesia:   She gets a sharp pain in her flanks and left > right leg.  Touch often feels like sharp pain..   Lyrica and lanmotrigine did not help.   Gabapentin caused cognitive changes.          Gait/strength/sensation:   She has mild weakness and spasticity in her legs, left worse than right. She has a foot drop. She uses a walker to help.  There is decreased sensation in the legs with associated clumsiness..       She feels her balance is poor and she does not feel steady if she is not holding something when she stands up.      Arms are fine.  Vision:    She notes that there is some visual blurring and reading is difficult. She does not wear glasses.   Bladder:    She she reports urinary frequency, urgency and frequent incontinence. Bladder medications have helped mildly.  History of transverse myelitis/possible MS:   In 2013, while in [place], she fell and had numbness below the waist and had severe leg weakness.  She did not have problems with her bladder.    She went to [facility] in [place] and [place] airlifted to [facility] and was hospitalized x 7 days.  She had MRIs and an LP in [place] but does not know results.   She had PT and improved by discharge and did additional Rehab at home and at [facility].    She improved almost to baseline, walking independently but having problems going downhill or downstairs.    In 2016, she had the gradual worsening in gait and had a more sudden change in July with much more difficulty with gait and numbness.    She saw Dr, Arnoldo Morale at [facility].     She had MRI's of the spine performed and saw Neurosurgery.   She was referred to Dr. Tomi Likens who saw her.   She improved a little bit so she did not need to use the walker as much.    She also had urinary incontinence.     In March 2017,  she had worsening of gait, numbness and bladder function.  A repeat MRI of the thoracic spine showed another enhancing focus, distinct from the first one.   She underwent a lumbar  puncture and the CSF was reportedly normal.    Anti-NMO Ab was borderline.    I saw her a few weeks later.   Repeat NMO-Ab was negative.      I have reviewed multiple MRIs. The MRI of the brain from 04/13/2015 and 12/30/2014 just showed 2-3 small T2/FLAIR hyperintense foci that are very nonspecific. MRI of the thoracic spine 02/23/2015 shows a lesion with edema at T2-T4.    MRI 09/20/2015 showed interval resolution of that focus. MRI of the thoracic spine 12/30/2015 shows a different enhancing lesion adjacent to T5-T6 associated with edema.  MRI of the cervical spine shows a normal spinal cord but she has significant foraminal narrowing at C5-C6 and C6-C7 due to degenerative changes.    I have also reviewed some laboratory tests. The NMO antibody was borderline at 1.6 initially but then was < 1.5 at second check.      She had a lumbar puncture for 01/09/2016. Although the actual results are not in the EMR there is a reference to the spinal fluid being normal on a telephone note.   An additional MRI of the brain in August 2017 showed no new lesions.Marland Kitchen    REVIEW OF SYSTEMS: Constitutional: No fevers, chills, sweats, or change in appetite Eyes: No visual changes, double vision, eye pain Ear, nose and throat: No hearing loss, ear pain, nasal congestion, sore throat Cardiovascular: No chest pain, palpitations Respiratory: No shortness of breath at rest or with exertion.   No wheezes GastrointestinaI: No nausea, vomiting, diarrhea, abdominal pain, fecal incontinence Genitourinary: No dysuria, urinary retention or frequency.  No nocturia. Musculoskeletal: No neck pain, back pain Integumentary: No rash, pruritus, skin lesions Neurological: as above Psychiatric: No depression at this time.  No anxiety Endocrine: No palpitations, diaphoresis, change in appetite, change in weigh or increased thirst Hematologic/Lymphatic: No anemia, purpura, petechiae. Allergic/Immunologic: No itchy/runny eyes, nasal  congestion, recent allergic reactions, rashes  ALLERGIES: Allergies  Allergen Reactions  . Penicillins Anaphylaxis    Swelling/throat closes   . Amoxicillin Swelling and Other (See Comments)    Unknown Eyes swelling  .  Latex Other (See Comments)    Rash and blisters from powder in glove  . Shellfish-Derived Products Itching  . Penicillin G Other (See Comments)    Unknown  . Iodine-131 Rash    HOME MEDICATIONS:  Current Outpatient Prescriptions:  .  baclofen (LIORESAL) 10 MG tablet, Take 1 tablet (10 mg total) by mouth 3 (three) times daily., Disp: 90 each, Rfl: 5 .  ibuprofen (ADVIL,MOTRIN) 800 MG tablet, Take 800 mg by mouth., Disp: , Rfl:  .  lidocaine (LIDODERM) 5 %, Place onto the skin., Disp: , Rfl:  .  oxybutynin (DITROPAN) 5 MG tablet, Take 1 tablet (5 mg total) by mouth 2 (two) times daily., Disp: 60 tablet, Rfl: 11 .  oxyCODONE (OXY IR/ROXICODONE) 5 MG immediate release tablet, TAKE 1 TO 3 TABLETS BY MOUTH EVERY 4 HOURS AS NEEDED FOR PAIN, Disp: , Rfl: 0 .  promethazine (PHENERGAN) 25 MG tablet, Take 25 mg by mouth every 6 (six) hours as needed for nausea or vomiting., Disp: , Rfl:  .  diazepam (VALIUM) 5 MG tablet, Take 1 tablet (5 mg total) by mouth every 8 (eight) hours as needed for anxiety., Disp: 90 tablet, Rfl: 5 .  esomeprazole (NEXIUM) 20 MG capsule, Take 20 mg by mouth., Disp: , Rfl:  .  HYDROcodone-acetaminophen (NORCO/VICODIN) 5-325 MG per tablet, Take 1-2 tablets by mouth every 6 hours as needed for pain and/or cough. (Patient not taking: Reported on 12/11/2016), Disp: 7 tablet, Rfl: 0 .  meclizine (ANTIVERT) 25 MG tablet, TAKE 1 TABLET BY MOUTH 4 TIMES A DAY FOR 7 DAYS, Disp: , Rfl: 0 .  nitrofurantoin, macrocrystal-monohydrate, (MACROBID) 100 MG capsule, TAKE 1 CAPSULE (100 MG TOTAL) BY MOUTH 2 (TWO) TIMES DAILY. (Patient not taking: Reported on 12/11/2016), Disp: 14 capsule, Rfl: 0 .  ondansetron (ZOFRAN) 4 MG tablet, Take 4 mg by mouth., Disp: , Rfl:  .   Oxcarbazepine (TRILEPTAL) 300 MG tablet, Take 1 tablet (300 mg total) by mouth 2 (two) times daily., Disp: 60 tablet, Rfl: 5 .  sertraline (ZOLOFT) 50 MG tablet, Take 50 mg by mouth., Disp: , Rfl:  .  traMADol (ULTRAM) 50 MG tablet, Take by mouth every 6 (six) hours as needed., Disp: , Rfl:  .  varenicline (CHANTIX CONTINUING MONTH PAK) 1 MG tablet, Take 1 tablet (1 mg total) by mouth 2 (two) times daily. (Patient not taking: Reported on 12/11/2016), Disp: 60 tablet, Rfl: 2 .  varenicline (CHANTIX) 0.5 MG tablet, 0.5 mg po bid for the first two weeks, then 1 mg pill po bid x 2 months (Patient not taking: Reported on 12/11/2016), Disp: 30 tablet, Rfl: 0  PAST MEDICAL HISTORY: Past Medical History:  Diagnosis Date  . Back pain   . Neuropathy ([place])   . Spinal stenosis     PAST SURGICAL HISTORY: Past Surgical History:  Procedure Laterality Date  . BREAST LUMPECTOMY Right    benign  . CHOLECYSTECTOMY    . TUBAL LIGATION      FAMILY HISTORY: Family History  Problem Relation Age of Onset  . Kidney cancer Mother     SOCIAL HISTORY:  Social History   Social History  . Marital status: Divorced    Spouse name: N/A  . Number of children: N/A  . Years of education: N/A   Occupational History  . Not on file.   Social History Main Topics  . Smoking status: Current Every Day Smoker    Packs/day: 0.50    Types: Cigarettes  .  Smokeless tobacco: Never Used  . Alcohol use No  . Drug use: No  . Sexual activity: Not on file   Other Topics Concern  . Not on file   Social History Narrative   ** Merged History Encounter **   Lives with boyfriend, Bridget [name].  Education 12th grade diploma.  Unemployed.  2 children.     2 cups coffee daily.         PHYSICAL EXAM  Vitals:   12/11/16 0947  BP: 118/82  Pulse: 74  Resp: 16  Weight: 164 lb (74.4 kg)  Height: 5\' 2"  (1.575 m)    Body mass index is 30 kg/m.   General: The patient is well-developed and well-nourished and in  no acute distress  Musculoskeletal:  Back is nontender  Neurologic Exam  Mental status: The patient is alert and oriented x 3 at the time of the examination. The patient has apparent normal recent and remote memory, with an apparently normal attention span and concentration ability.   Speech is normal.  Cranial nerves: Extraocular movements are full. Pupils are equal, round, and reactive to light and accomodation.   There is good facial sensation to soft touch bilaterally.Facial strength is normal.  Trapezius and sternocleidomastoid strength is normal. No dysarthria is noted.  The tongue is midline, and the patient has symmetric elevation of the soft palate. No obvious hearing deficits are noted.  Motor:  Muscle bulk is normal.   Tone is increased in left > right legs.   Strength is  4+ / 5 in legs, slightly worse on the left.     Sensory: Sensory testing is intact to pinprick, soft touch and vibration sensation in arms.  She has a mid thoracic sensory level with a reduced touch on the right and temperature and touch in the legs, worse on the left.   Vibration sensation was asymetric, much stronger (even painful) on the right.   Coordination: Cerebellar testing reveals good finger-nose-finger and reduced heel-to-shin bilaterally.  Gait and station: Station is normal.   Gait is mildly spastic and wide.  She needs a walker.. She cannot tandem.   Romberg is positive.   Reflexes: Deep tendon reflexes are symmetric and normal in arms but increased in legs with spread at the knees and 2-3 beats nonsustained ankle clonus.        DIAGNOSTIC DATA (LABS, IMAGING, TESTING) - I reviewed patient records, labs, notes, testing and imaging myself where available.   Lab Results  Component Value Date   TSH 0.20 (L) 01/18/2016       ASSESSMENT AND PLAN  Transverse myelitis ([place]) - Plan: Neuromyelitis optica autoab, IgG, Comprehensive metabolic panel  Bladder neurogenesis  Dysesthesia  Gait  disturbance  Anxiety state   1.   Recheck NMO-Ab to r/o Devic's 2.    To help with her spasms and jerking, Valium 5 mg 3 times a day will be added. I'll refill her baclofen and she can take that as needed if it does not make her too sleepy. Additionally, we'll add oxcarbazepine to see if that will help her dysesthesias.  3.   She will return to see me in 4-5 months or sooner if there are new or worsening neurologic symptoms.   Zacharias Ridling A. Felecia Shelling, MD, PhD 0/05/6577, 46:96 AM Certified in Neurology, Clinical Neurophysiology, Sleep Medicine, Pain Medicine and Neuroimaging  [facility] [address] [place], [place] [address] [phone]

## 2016-12-16 LAB — COMPREHENSIVE METABOLIC PANEL
ALT: 12 IU/L (ref 0–32)
AST: 13 IU/L (ref 0–40)
Albumin/Globulin Ratio: 1.9 (ref 1.2–2.2)
Albumin: 4.3 g/dL (ref 3.5–5.5)
Alkaline Phosphatase: 63 IU/L (ref 39–117)
BUN/Creatinine Ratio: 13 (ref 9–23)
BUN: 13 mg/dL (ref 6–24)
Bilirubin Total: 0.3 mg/dL (ref 0.0–1.2)
CO2: 24 mmol/L (ref 18–29)
Calcium: 9.3 mg/dL (ref 8.7–10.2)
Chloride: 98 mmol/L (ref 96–106)
Creatinine, Ser: 1 mg/dL (ref 0.57–1.00)
GFR calc Af Amer: 76 mL/min/{1.73_m2} (ref >59)
GFR calc non Af Amer: 66 mL/min/{1.73_m2} (ref >59)
Globulin, Total: 2.3 g/dL (ref 1.5–4.5)
Glucose: 90 mg/dL (ref 65–99)
Potassium: 4.6 mmol/L (ref 3.5–5.2)
Sodium: 137 mmol/L (ref 134–144)
Total Protein: 6.6 g/dL (ref 6.0–8.5)

## 2016-12-16 LAB — NEUROMYELITIS OPTICA AUTOAB, IGG: NMO IgG Autoantibodies: <1.5 U/mL (ref 0.0–3.0)

## 2016-12-17 ENCOUNTER — Telehealth: Payer: Self-pay | Admitting: *Deleted

## 2016-12-17 NOTE — Telephone Encounter (Signed)
I have spoken with Dorianna this am and per RAS, advised labs done in our office look ok.  She verbalized understanding of same/fim

## 2016-12-17 NOTE — Telephone Encounter (Signed)
-----   Message from Britt Bottom, MD sent at 12/16/2016  1:51 PM EDT ----- Please let the patient know that the lab work is fine.

## 2017-01-09 ENCOUNTER — Ambulatory Visit: Payer: Medicaid Other | Admitting: Neurology

## 2017-01-16 ENCOUNTER — Encounter: Payer: Self-pay | Admitting: *Deleted

## 2017-01-16 ENCOUNTER — Telehealth: Payer: Self-pay | Admitting: Neurology

## 2017-01-16 ENCOUNTER — Other Ambulatory Visit: Payer: Self-pay | Admitting: *Deleted

## 2017-01-16 DIAGNOSIS — R350 Frequency of micturition: Secondary | ICD-10-CM

## 2017-01-16 DIAGNOSIS — R8290 Unspecified abnormal findings in urine: Secondary | ICD-10-CM

## 2017-01-16 NOTE — Telephone Encounter (Signed)
Pt called said she is dragging the right leg, has a knot in her back-cannot lie on her back, kidney worse-voiding frequently. An appt was scheduled for 5/15. She would like to be seen sooner

## 2017-01-16 NOTE — Telephone Encounter (Signed)
I have spoken with Bridget [name] this afternoon. She c/o urinary frequency, cloudy urine, ammonia smell to urine.  Was treated by the ER a week ago for uti but sx. have not resolved.  She will come to the office for u/a, c& s tomorrow, bring the name of the antibiotic she has already been treated with, and RAS will rx. another antibiotic if appropriate. If UTI persists, RAS may consider urology referral.  Pt. is agreeable with this.  I discussed this plan with RAS and he is agreeable/fim

## 2017-01-17 NOTE — Telephone Encounter (Signed)
Pt. did not present to our office for u/a, c&s today. I attempted to contact her, but received vm.  I lmom requesting update.  She may have decided to see pcp/urgent care instead of coming to our office today./fim

## 2017-02-18 ENCOUNTER — Encounter: Payer: Self-pay | Admitting: Neurology

## 2017-02-18 ENCOUNTER — Ambulatory Visit (INDEPENDENT_AMBULATORY_CARE_PROVIDER_SITE_OTHER): Payer: Medicare HMO | Admitting: Neurology

## 2017-02-18 VITALS — BP 115/74 | HR 70 | Resp 18 | Ht 62.0 in | Wt 164.0 lb

## 2017-02-18 DIAGNOSIS — R208 Other disturbances of skin sensation: Secondary | ICD-10-CM

## 2017-02-18 DIAGNOSIS — K592 Neurogenic bowel, not elsewhere classified: Secondary | ICD-10-CM | POA: Diagnosis not present

## 2017-02-18 DIAGNOSIS — N319 Neuromuscular dysfunction of bladder, unspecified: Secondary | ICD-10-CM

## 2017-02-18 DIAGNOSIS — G373 Acute transverse myelitis in demyelinating disease of central nervous system: Secondary | ICD-10-CM

## 2017-02-18 DIAGNOSIS — R269 Unspecified abnormalities of gait and mobility: Secondary | ICD-10-CM

## 2017-02-18 MED ORDER — OXCARBAZEPINE 300 MG PO TABS
ORAL_TABLET | ORAL | 11 refills | Status: DC
Start: 2017-02-18 — End: 2017-06-11

## 2017-02-18 NOTE — Progress Notes (Signed)
[facility]  PATIENT: Bridget SIVERSON DOB: 02-Jul-1966  REFERRING DOCTOR OR PCP:  Dr. Tomi Likens [facility]).   PCP is Heide Scales SOURCE: Patient, notes from Dr. Tomi Likens, MRI reports and images on PACS, lab reports  _________________________________   HISTORICAL  CHIEF COMPLAINT:  Chief Complaint  Patient presents with  . Transverse Myelitis    Sts. back pain is worse, legs are weaker; having more trouble picking feet up.  Using a rolling walker today.  Sts. she only takes Valium only at night and feels it helps her sleep.  She doesn't take it during the day because she feels it makes her "out there."   Says  Trileptal has only helped a little with dysesthesias.  She just started PT at [facility] yesterday.      HISTORY OF PRESENT ILLNESS:  Bridget [name]) Is a 51 year old woman who has had 2 episodes of transverse myelitis in 2016 and 2017  TM:   She feels that her walking is worsening. She had transverse myelitis twice, initially in 2016 and again in 2017 at 2 different levels in the thoracic spine.   MRI of the brain shows a few foci that are nonspecific and have been stable. Initial neuromyelitis optica antibody is negative.  She is experiencing more pain recently..  I reviewed images from her MRI of the brain dated 05/14/2016 showed a few small subcortical and periventricular white matter foci but no change when compared to the previous MRI from 2017. The foci are nonspecific.  LBP/spasms/jerking:    She is having more lower back pain and spasms/jerks.    She is doing some PT but has a lot of difficulty getting comfortable.   She was advised not to sit in a recliner and to try to exercise more   They are going to consider a longer brace (to include her knee)  She gets pain in her lower back, worse if she sits a while or lays flat.  She gets jerking in her legs and often falls.   The jerking is throughout the day but worse if she is drowsy.   A couple weeks  ago she went to [facility]. An x-ray of the back just showed minimal degenerative changes at L3-L4 and L4-L5. She has a known disc protrusion at L4-L5 on a 2016 MRI but no definite nerve root compression.  Gait/strength:   There is weakness and spasticity in her legs, left worse than right. She has a footdrop on the left.         She feels her balance is poor and she does not feel steady if she is not holding something when she stands up.      Arms are fine.  Dysesthesia:   She has dysesthetic pain with allodynia in the left flank and upper leg region.  She has more allodynia without resting dysesthesia in the right leg.    Lyrica and lanmotrigine did not help.   Gabapentin caused cognitive changes and she stopped         Vision:    She notes that there is some visual blurring and reading is difficult. She does not wear glasses.   Bladder:    She has reports urinary frequency, urgency and frequent incontinence. She has moderate constipation.   History of transverse myelitis/possible MS:   In 2013, while in [place], she fell and had numbness below the waist and had severe leg weakness.  She did not have problems with  her bladder.    She went to [facility]) in [place] and [place] airlifted to [facility] and was hospitalized x 7 days.  She had MRIs and an LP in [place] but does not know results.   She had PT and improved by discharge and did additional Rehab at home and at [facility]    She improved almost to baseline, walking independently but having problems going downhill or downstairs.    In 2016, she had the gradual worsening in gait and had a more sudden change in July with much more difficulty with gait and numbness.    She saw Dr, Arnoldo Morale at [facility].     She had MRI's of the spine performed and saw Neurosurgery.   She was referred to Dr. Tomi Likens who saw her.   She improved a little bit so she did not need to use the walker as much.    She  also had urinary incontinence.     In March 2017,  she had worsening of gait, numbness and bladder function.  A repeat MRI of the thoracic spine showed another enhancing focus, distinct from the first one.   She underwent a lumbar puncture and the CSF was reportedly normal.    Anti-NMO Ab was borderline.    I saw her a few weeks later.   Repeat NMO-Ab was negative.      I have reviewed multiple MRIs. The MRI of the brain from 04/13/2015 and 12/30/2014 just showed 2-3 small T2/FLAIR hyperintense foci that are very nonspecific. MRI of the thoracic spine 02/23/2015 shows a lesion with edema at T2-T4.    MRI 09/20/2015 showed interval resolution of that focus. MRI of the thoracic spine 12/30/2015 shows a different enhancing lesion adjacent to T5-T6 associated with edema.  MRI of the cervical spine shows a normal spinal cord but she has significant foraminal narrowing at C5-C6 and C6-C7 due to degenerative changes.    I have also reviewed some laboratory tests. The NMO antibody was borderline at 1.6 initially but then was < 1.5 at second check.      She had a lumbar puncture for 01/09/2016. Although the actual results are not in the EMR there is a reference to the spinal fluid being normal on a telephone note.   An additional MRI of the brain in August 2017 showed no new lesions.Marland Kitchen    REVIEW OF SYSTEMS: Constitutional: No fevers, chills, sweats, or change in appetite Eyes: No visual changes, double vision, eye pain Ear, nose and throat: No hearing loss, ear pain, nasal congestion, sore throat Cardiovascular: No chest pain, palpitations Respiratory: No shortness of breath at rest or with exertion.   No wheezes GastrointestinaI: No nausea, vomiting, diarrhea, abdominal pain, fecal incontinence Genitourinary: No dysuria, urinary retention or frequency.  No nocturia. Musculoskeletal: No neck pain, back pain Integumentary: No rash, pruritus, skin lesions Neurological: as above Psychiatric: No depression at  this time.  No anxiety Endocrine: No palpitations, diaphoresis, change in appetite, change in weigh or increased thirst Hematologic/Lymphatic: No anemia, purpura, petechiae. Allergic/Immunologic: No itchy/runny eyes, nasal congestion, recent allergic reactions, rashes  ALLERGIES: Allergies  Allergen Reactions  . Penicillins Anaphylaxis    Swelling/throat closes   . Amoxicillin Swelling and Other (See Comments)    Unknown Eyes swelling  . Latex Other (See Comments)    Rash and blisters from powder in glove  . Shellfish-Derived Products Itching  . Penicillin G Other (See Comments)    Unknown  .  Iodine-131 Rash    HOME MEDICATIONS:  Current Outpatient Prescriptions:  .  baclofen (LIORESAL) 10 MG tablet, Take 1 tablet (10 mg total) by mouth 3 (three) times daily., Disp: 90 each, Rfl: 5 .  diazepam (VALIUM) 5 MG tablet, Take 1 tablet (5 mg total) by mouth every 8 (eight) hours as needed for anxiety., Disp: 90 tablet, Rfl: 5 .  esomeprazole (NEXIUM) 20 MG capsule, Take 20 mg by mouth., Disp: , Rfl:  .  HYDROcodone-acetaminophen (NORCO/VICODIN) 5-325 MG per tablet, Take 1-2 tablets by mouth every 6 hours as needed for pain and/or cough., Disp: 7 tablet, Rfl: 0 .  ibuprofen (ADVIL,MOTRIN) 800 MG tablet, Take 800 mg by mouth., Disp: , Rfl:  .  meclizine (ANTIVERT) 25 MG tablet, TAKE 1 TABLET BY MOUTH 4 TIMES A DAY FOR 7 DAYS, Disp: , Rfl: 0 .  nitrofurantoin, macrocrystal-monohydrate, (MACROBID) 100 MG capsule, TAKE 1 CAPSULE (100 MG TOTAL) BY MOUTH 2 (TWO) TIMES DAILY., Disp: 14 capsule, Rfl: 0 .  ondansetron (ZOFRAN) 4 MG tablet, Take 4 mg by mouth., Disp: , Rfl:  .  Oxcarbazepine (TRILEPTAL) 300 MG tablet, One po qM and two po qHS, Disp: 90 tablet, Rfl: 11 .  oxybutynin (DITROPAN) 5 MG tablet, Take 1 tablet (5 mg total) by mouth 2 (two) times daily., Disp: 60 tablet, Rfl: 11 .  oxyCODONE (OXY IR/ROXICODONE) 5 MG immediate release tablet, TAKE 1 TO 3 TABLETS BY MOUTH EVERY 4 HOURS AS  NEEDED FOR PAIN, Disp: , Rfl: 0 .  promethazine (PHENERGAN) 25 MG tablet, Take 25 mg by mouth every 6 (six) hours as needed for nausea or vomiting., Disp: , Rfl:  .  traMADol (ULTRAM) 50 MG tablet, Take by mouth every 6 (six) hours as needed., Disp: , Rfl:  .  varenicline (CHANTIX CONTINUING MONTH PAK) 1 MG tablet, Take 1 tablet (1 mg total) by mouth 2 (two) times daily., Disp: 60 tablet, Rfl: 2 .  varenicline (CHANTIX) 0.5 MG tablet, 0.5 mg po bid for the first two weeks, then 1 mg pill po bid x 2 months, Disp: 30 tablet, Rfl: 0 .  sertraline (ZOLOFT) 50 MG tablet, Take 50 mg by mouth., Disp: , Rfl:   PAST MEDICAL HISTORY: Past Medical History:  Diagnosis Date  . Back pain   . Neuropathy   . Spinal stenosis     PAST SURGICAL HISTORY: Past Surgical History:  Procedure Laterality Date  . BREAST LUMPECTOMY Right    benign  . CHOLECYSTECTOMY    . TUBAL LIGATION      FAMILY HISTORY: Family History  Problem Relation Age of Onset  . Kidney cancer Mother     SOCIAL HISTORY:  Social History   Social History  . Marital status: Divorced    Spouse name: N/A  . Number of children: N/A  . Years of education: N/A   Occupational History  . Not on file.   Social History Main Topics  . Smoking status: Current Every Day Smoker    Packs/day: 0.50    Types: Cigarettes  . Smokeless tobacco: Never Used  . Alcohol use No  . Drug use: No  . Sexual activity: Not on file   Other Topics Concern  . Not on file   Social History Narrative   ** Merged History Encounter **   Lives with boyfriend, Adriana Mccallum.  Education 12th grade diploma.  Unemployed.  2 children.     2 cups coffee daily.  PHYSICAL EXAM  Vitals:   02/18/17 1408  BP: 115/74  Pulse: 70  Resp: 18  Weight: 164 lb (74.4 kg)  Height: 5\' 2"  (1.575 m)    Body mass index is 30 kg/m.   General: The patient is well-developed and well-nourished and in no acute distress  Musculoskeletal:  Back is  nontender  Neurologic Exam  Mental status: The patient is alert and oriented x 3 at the time of the examination. The patient has apparent normal recent and remote memory, with an apparently normal attention span and concentration ability.   Speech is normal.  Cranial nerves: Extraocular movements are full. Facial sense and strength is normal.  Trapezius and sternocleidomastoid strength is normal. No dysarthria is noted.  The tongue is midline, and the patient has symmetric elevation of the soft palate. No obvious hearing deficits are noted.  Motor:  Muscle bulk is normal.   Tone is increased in left > right legs.   Strength is  4+ / 5 in legs, slightly worse on the left.     Sensory: Sensory testing is intact to pinprick, soft touch and vibration sensation in arms.  She has a mid thoracic sensory level with a reduced touch on the right and temperature and touch in the legs, worse on the left.  There is more allodynia in the right leg. Vibration sensation was reduced on the left and painful on the right.    Coordination: Cerebellar testing reveals good finger-nose-finger but reduced heel-to-shin bilaterally.  Gait and station: Station is normal.   Gait is spastic and wide.  She needs a walker.. She cannot tandem.   Romberg is positive.   Reflexes: Deep tendon reflexes are symmetric and normal in arms but increased in legs with spread at the knees and 2-3 beats nonsustained ankle clonus.        DIAGNOSTIC DATA (LABS, IMAGING, TESTING) - I reviewed patient records, labs, notes, testing and imaging myself where available.   Lab Results  Component Value Date   TSH 0.20 (L) 01/18/2016        ASSESSMENT AND PLAN  Transverse myelitis ([place]) - Plan: MR BRAIN W WO CONTRAST, MR CERVICAL SPINE W WO CONTRAST, MR THORACIC SPINE W WO CONTRAST, Ambulatory referral to Urology  Dysesthesia - Plan: MR BRAIN W WO CONTRAST, MR CERVICAL SPINE W WO CONTRAST, MR THORACIC SPINE W WO CONTRAST  Gait  disturbance - Plan: MR BRAIN W WO CONTRAST, MR CERVICAL SPINE W WO CONTRAST, MR THORACIC SPINE W WO CONTRAST  Neurogenic bowel  Bladder neurogenesis - Plan: Ambulatory referral to Urology   1.   Continueth baclofen and diazepam for her spasms and jerking.  I advised her to try to take these 3 times a day if possible. I'll increase the oxcarbazepine to 900 mg daily in split dose.. 2.  MRI of the brain and cervical thoracic spine to determine if there has been subclinical progression. If present, the possibility that she has MS is higher and we will need to consider a disease modifying therapy. 3.   She will return to see me in 4-5 months or sooner if there are new or worsening neurologic symptoms.   Cleo Villamizar A. Felecia Shelling, MD, PhD 7/82/9562, 1:30 PM Certified in Neurology, Clinical Neurophysiology, Sleep Medicine, Pain Medicine and Neuroimaging  [facility] [address] [place], [place] [address] [phone]

## 2017-02-18 NOTE — Patient Instructions (Signed)
If possible, try to increase the baclofen to 3 times a day. If possible, try to increase the Valium to 3 times a day. If he gets too sleepy during the daytime, he might still be able to take 1 pill of each in the afternoon and 1 pill of each at bedtime  I would increase your oxcarbazepine to 1 pill in the morning and 2 pills at night  We will recheck the MRI of the brain to see if there has been any more lesions that would make MS more likely.

## 2017-03-12 ENCOUNTER — Ambulatory Visit
Admission: RE | Admit: 2017-03-12 | Discharge: 2017-03-12 | Disposition: A | Discharge status: Home / Self Care | Payer: Medicare HMO | Source: Ambulatory Visit | Attending: Neurology | Admitting: Neurology

## 2017-03-12 ENCOUNTER — Ambulatory Visit
Admission: RE | Admit: 2017-03-12 | Discharge: 2017-03-12 | Disposition: A | Discharge status: Home / Self Care | Payer: Medicaid Other | Source: Ambulatory Visit | Attending: Neurology | Admitting: Neurology

## 2017-03-12 DIAGNOSIS — G373 Acute transverse myelitis in demyelinating disease of central nervous system: Secondary | ICD-10-CM

## 2017-03-12 DIAGNOSIS — R208 Other disturbances of skin sensation: Secondary | ICD-10-CM

## 2017-03-12 DIAGNOSIS — R269 Unspecified abnormalities of gait and mobility: Secondary | ICD-10-CM

## 2017-03-12 MED ORDER — GADOBENATE DIMEGLUMINE 529 MG/ML IV SOLN
15.0000 mL | Freq: Once | INTRAVENOUS | Status: AC | PRN
  Administered 2017-03-12: 14 mL via INTRAVENOUS

## 2017-03-17 ENCOUNTER — Telehealth: Payer: Self-pay | Admitting: *Deleted

## 2017-03-17 DIAGNOSIS — R269 Unspecified abnormalities of gait and mobility: Secondary | ICD-10-CM

## 2017-03-17 DIAGNOSIS — M21371 Foot drop, right foot: Secondary | ICD-10-CM

## 2017-03-17 DIAGNOSIS — M4802 Spinal stenosis, cervical region: Secondary | ICD-10-CM

## 2017-03-17 NOTE — Telephone Encounter (Signed)
I have spoken with Bridget [name], and per RAS, reviewed MRI results as below.  She verbalized understanding of same, is agreeable to NS consult to see if cervical spinal stenosis is contributing to gait disturbance.  Would like to see [facility] ,as she believes she has seen them in the past.  Order placed in EPIC/fim

## 2017-03-17 NOTE — Telephone Encounter (Signed)
-----   Message from Britt Bottom, MD sent at 03/14/2017  1:43 PM EDT ----- Please let her know: The MRI of the brain is unchanged The MRI of the thoracic spine showed no new lesions and the one that was new in 2017 looks much better  The MRI of the cervical spine shows no lesions --- she does have significant spinal stenosis at 3 levels (C4 to C7).   This appears stable compared to 2017.  However, since gait continued to worsen, I would like her to go back to Neurosurgery to see if any procedure needs to be done in her neck

## 2017-03-18 NOTE — Telephone Encounter (Signed)
Pt said she called [facility] and was told nothing has been rec'd  Please call at [phone]

## 2017-03-19 NOTE — Telephone Encounter (Signed)
Patient has apt with Dr. Katherine Roan on June 26 th 2018 . Patient is aware.

## 2017-04-02 NOTE — Telephone Encounter (Signed)
Alcalde (boyfriend) listed on DPR can contact him if patient is unavailable.

## 2017-04-02 NOTE — Telephone Encounter (Signed)
Patient went to see Dr. Katherine Roan yesterday per patient Dr. Katherine Roan didn't see anything wrong with the patients back just seen bulging disc near neck and patient would like a second opinion at [facility] spine surgeon.  Patients right leg is dragging and left leg is beginning.   Please call

## 2017-04-02 NOTE — Addendum Note (Signed)
Addended by: France Ravens I on: 04/02/2017 03:57 PM   Modules accepted: Orders

## 2017-04-04 NOTE — Telephone Encounter (Signed)
Called and spoke to Patient and she wants to go to [facility] Patient can get in quicker. [facility] is booking to Sept. Patient just want's to see someone other than Arnoldo Morale . [facility] is aware.

## 2017-04-07 NOTE — Telephone Encounter (Signed)
Noted/fim 

## 2017-04-08 NOTE — Telephone Encounter (Signed)
Tried to call Neuro surgery back. Patient want's to go to [facility] . Patient request's to see another physician. Patient will still go to [facility] just want's another doctor.

## 2017-04-08 NOTE — Telephone Encounter (Signed)
[name] with [facility] Dr. Jannifer Rodney is calling regarding a referral received. She has questions. Patient is an established patient with Dr. Arnoldo Morale but under the name of Bridget [name]. Please call and discuss.

## 2017-04-14 ENCOUNTER — Ambulatory Visit: Payer: Medicaid Other | Admitting: Neurology

## 2017-04-18 NOTE — Telephone Encounter (Signed)
[name] from [facility] is asking a call back from you

## 2017-04-23 NOTE — Telephone Encounter (Signed)
Waiting for call back to see if I can help [facility] with patient .

## 2017-04-23 NOTE — Telephone Encounter (Signed)
Spoke with [place] and she relayed she has tried to called patient x4 Patient has not returned her telephone calls. I called Patient and left her a message asking her to call me back or [name] at [facility] . [phone].

## 2017-04-24 NOTE — Telephone Encounter (Signed)
Noted/fim 

## 2017-04-24 NOTE — Telephone Encounter (Signed)
Patient called and relayed for Me to to CX her referral  With Neurosurgery . Patient relayed she wanted to hold off at this time. [facility] and she [facility] today.

## 2017-04-27 ENCOUNTER — Other Ambulatory Visit: Payer: Self-pay | Admitting: Neurology

## 2017-05-22 ENCOUNTER — Other Ambulatory Visit: Payer: Self-pay

## 2017-05-22 DIAGNOSIS — R609 Edema, unspecified: Secondary | ICD-10-CM

## 2017-06-11 ENCOUNTER — Other Ambulatory Visit: Payer: Self-pay | Admitting: Neurology

## 2017-06-19 ENCOUNTER — Other Ambulatory Visit: Payer: Self-pay | Admitting: Neurology

## 2017-07-08 ENCOUNTER — Telehealth: Payer: Self-pay | Admitting: *Deleted

## 2017-07-08 MED ORDER — OXCARBAZEPINE 300 MG PO TABS: 300.0000 mg | ORAL_TABLET | Freq: Two times a day (BID) | ORAL | 1 refills | Status: DC

## 2017-07-08 NOTE — Telephone Encounter (Signed)
Oxcarbazepine rx. changed from a 30 to 90 day rx. per pharmacy request/fim

## 2017-07-17 ENCOUNTER — Ambulatory Visit (INDEPENDENT_AMBULATORY_CARE_PROVIDER_SITE_OTHER): Payer: Medicare HMO | Admitting: Vascular Surgery

## 2017-07-17 ENCOUNTER — Ambulatory Visit (HOSPITAL_COMMUNITY)
Admission: RE | Admit: 2017-07-17 | Discharge: 2017-07-17 | Disposition: A | Discharge status: Home / Self Care | Payer: Medicare HMO | Source: Ambulatory Visit | Attending: Vascular Surgery | Admitting: Vascular Surgery

## 2017-07-17 ENCOUNTER — Encounter: Payer: Self-pay | Admitting: Vascular Surgery

## 2017-07-17 VITALS — BP 121/79 | HR 64 | Temp 98.1°F | Resp 16 | Ht 62.0 in | Wt 165.0 lb

## 2017-07-17 DIAGNOSIS — R609 Edema, unspecified: Secondary | ICD-10-CM | POA: Insufficient documentation

## 2017-07-17 DIAGNOSIS — M7989 Other specified soft tissue disorders: Secondary | ICD-10-CM

## 2017-07-17 NOTE — Progress Notes (Signed)
Referring Physician: Clement Husbands  Patient name: Bridget [name] MRN: [id number] DOB: [birth year] Sex: female  REASON FOR CONSULT: leg swelling/pain  HPI: Bridget [name] is a 51 y.o. female,  Who presents today for leg swelling.  She states that she has been having burning and jerking of her legs that go up to her spine.  She states that this has been going on for 2 years and states that it started after she was hospitalized in [place].  She is unable to tell why she was at [facility] except that her thyroid was tested.  She states that her legs occasionally swell after she has been on them for a while.   She states she has seen a spine surgeon who did an MRI in the past and that she did not need surgery.   Her family member told us that she has been told that she needs to exercise and to use it or lose it.  She states she has been told that she has neuropathy.  She does take hydrocodone for the pain as well as valium.  She states she does not take it every day and that she does not get much relief from this.  She states that her feet are extremely sensitive.  She states that she has been tested for diabetes and this was negative.    She is on Zoloft for depression.  She has a hx of dizziness.  The pt is on a statin for cholesterol management.   Past Medical History:  Diagnosis Date  . Back pain   . Neuropathy   . Spinal stenosis    Past Surgical History:  Procedure Laterality Date  . BREAST LUMPECTOMY Right    benign  . CHOLECYSTECTOMY    . TUBAL LIGATION      Family History  Problem Relation Age of Onset  . Kidney cancer Mother     SOCIAL HISTORY: Social History   Social History  . Marital status: Divorced    Spouse name: N/A  . Number of children: N/A  . Years of education: N/A   Occupational History  . Not on file.   Social History Main Topics  . Smoking status: Current Every Day Smoker    Packs/day: 0.50    Types: Cigarettes  . Smokeless tobacco: Never Used  .  Alcohol use No  . Drug use: No  . Sexual activity: Not on file   Other Topics Concern  . Not on file   Social History Narrative   ** Merged History Encounter **   Lives with boyfriend, Adriana Mccallum.  Education 12th grade diploma.  Unemployed.  2 children.     2 cups coffee daily.        Allergies  Allergen Reactions  . Penicillins Anaphylaxis, Rash and Swelling    Throat swelling Swelling/throat closes   . Amoxicillin Swelling and Other (See Comments)    Unknown Unknown Eyes swelling  . Latex Other (See Comments) and Rash    Rash and blisters from powder in glove Rash and blisters from powder in glove  . Shellfish-Derived Products Itching  . Penicillin G Other (See Comments)    Unknown  . Celecoxib Itching  . Iodine-131 Rash    Current Outpatient Prescriptions  Medication Sig Dispense Refill  . diazepam (VALIUM) 5 MG tablet Take 5 mg by mouth.    . hydrochlorothiazide (HYDRODIURIL) 12.5 MG tablet Take 12.5 mg by mouth.    Marland Kitchen HYDROcodone-acetaminophen (NORCO/VICODIN) 5-325 MG  per tablet Take 1-2 tablets by mouth every 6 hours as needed for pain and/or cough. 7 tablet 0  . ibuprofen (ADVIL,MOTRIN) 800 MG tablet Take 800 mg by mouth.    . meloxicam (MOBIC) 7.5 MG tablet Take 7.5 mg by mouth 2 (two) times daily.    . mirabegron ER (MYRBETRIQ) 50 MG TB24 tablet Take 50 mg by mouth.    Marland Kitchen omeprazole (PRILOSEC) 40 MG capsule Take 40 mg by mouth.    . ondansetron (ZOFRAN) 4 MG tablet Take 4 mg by mouth.    . Oxcarbazepine (TRILEPTAL) 300 MG tablet Take 1 tablet (300 mg total) by mouth 2 (two) times daily. 180 tablet 1  . traMADol (ULTRAM) 50 MG tablet Take by mouth every 6 (six) hours as needed.    Marland Kitchen TRIMETHOPRIM PO Take 100 mg by mouth daily.    . baclofen (LIORESAL) 10 MG tablet Take 1 tablet (10 mg total) by mouth 3 (three) times daily. (Patient not taking: Reported on 07/17/2017) 90 each 5  . diazepam (VALIUM) 5 MG tablet TAKE 1 TABLET BY MOUTH EVERY 8 HOURS AS NEEDED FOR  ANXIETY (Patient not taking: Reported on 07/17/2017) 90 tablet 5  . esomeprazole (NEXIUM) 20 MG capsule Take 20 mg by mouth.    . lovastatin (MEVACOR) 20 MG tablet     . meclizine (ANTIVERT) 25 MG tablet TAKE 1 TABLET BY MOUTH 4 TIMES A DAY FOR 7 DAYS  0  . nitrofurantoin, macrocrystal-monohydrate, (MACROBID) 100 MG capsule TAKE 1 CAPSULE (100 MG TOTAL) BY MOUTH 2 (TWO) TIMES DAILY. (Patient not taking: Reported on 07/17/2017) 14 capsule 0  . oxybutynin (DITROPAN) 5 MG tablet TAKE 1 TABLET (5 MG TOTAL) BY MOUTH 2 (TWO) TIMES DAILY. (Patient not taking: Reported on 07/17/2017) 60 tablet 8  . oxyCODONE (OXY IR/ROXICODONE) 5 MG immediate release tablet TAKE 1 TO 3 TABLETS BY MOUTH EVERY 4 HOURS AS NEEDED FOR PAIN  0  . promethazine (PHENERGAN) 25 MG tablet Take 25 mg by mouth every 6 (six) hours as needed for nausea or vomiting.    . sertraline (ZOLOFT) 50 MG tablet Take 50 mg by mouth.    . varenicline (CHANTIX CONTINUING MONTH PAK) 1 MG tablet Take 1 tablet (1 mg total) by mouth 2 (two) times daily. (Patient not taking: Reported on 07/17/2017) 60 tablet 2  . varenicline (CHANTIX) 0.5 MG tablet 0.5 mg po bid for the first two weeks, then 1 mg pill po bid x 2 months (Patient not taking: Reported on 07/17/2017) 30 tablet 0   No current facility-administered medications for this visit.     ROS:  Cardiac  Comments:  Chest pain or chest pressure:    Shortness of breath upon exertion:    Short of breath when lying flat:    Irregular heart rhythm:        Vascular    Pain in calf, thigh, or hip brought on by ambulation: x   Pain in feet at night that wakes you up from your sleep:  x   Blood clot in your veins:    Leg swelling:  x       Pulmonary    Oxygen at home:    Productive cough:     Wheezing:         Neurologic    Sudden weakness in arms or legs:  x legs  Sudden numbness in arms or legs:   legs  Sudden onset of difficulty speaking or slurred speech:  Temporary loss of vision in one  eye:     Problems with dizziness:  x       Gastrointestinal    Blood in stool:     Vomited blood:         Genitourinary    Burning when urinating:     Blood in urine:        Psychiatric    Major depression:  x       Hematologic    Bleeding problems:    Problems with blood clotting too easily:        Skin    Rashes or ulcers:        Constitutional    Fever or chills:       Physical Examination  Vitals:   07/17/17 1416  BP: 121/79  Pulse: 64  Resp: 16  Temp: 98.1 F (36.7 C)  SpO2: 99%  Weight: 165 lb (74.8 kg)  Height: 5\' 2"  (1.575 m)    Body mass index is 30.18 kg/m.  General:  Alert and oriented, no acute distress HEENT: Normal Neck: No bruit or JVD Pulmonary: Clear to auscultation bilaterally Cardiac: Regular Rate and Rhythm without murmur Abdomen: Soft, non-tender, non-distended, no mass, no scars Skin: No rash Extremity Pulses:  2+ radial, brachial, femoral, dorsalis pedis, posterior tibial pulses bilaterally Musculoskeletal: No deformity or edema  Neurologic: Upper and lower extremity motor 5/5 and symmetric  DATA:  Lower extremity venous duplex 07/17/17: No evidence of DVT or superficial thrombosis No venous incompetence noted bilaterally   ASSESSMEN/TPLAN:  51 year old with leg swelling/leg pain bilaterally  -Pt has normal pulse exam and no evidence of arterial insufficiency to suggest this is causing her sx.  Her venous duplex was also negative for venous insufficiency.  She does have some component of neuropathy, but unsure of the cause.  May benefit from gabapentin or Lyrica to see if she can get some relief.  Pt encouraged to exercise as much as she can tolerate. Will see her back on prn basis  Leontine Locket, PA-C [facility] seen and examined with Dr. Oneida Alar Office: [phone] Pager: [phone]  Exam details as above. The patient most likely has peripheral neuropathy. She did not really have  significant edema today. She apparently has edema around her ankles and feet occasionally. This more likely is related to dependency rather than any vascular etiology. She had a normal arterial exam today. She had a normal venous duplex today. I discussed with her the possibility of other medications for treating her neuropathy and will leave this at the discretion of her primary care physician. Also discussed with her the possibility of going to a pain clinic and we'll also leave this at the discretion of her primary care physician. She will follow-up with [place] on an as-needed basis.  Ruta Hinds, MD [facility] [place] Office: [phone] Pager: [phone]

## 2017-08-21 ENCOUNTER — Ambulatory Visit: Payer: Medicaid Other | Admitting: Neurology

## 2017-09-13 IMAGING — MR MR HEAD WO/W CM
9 of 11 series · 34 of 48 positions shown · IV contrast (multihance)
Comparison: Brain MRI 05/24/2015, 04/13/2015.

CLINICAL DATA: 50-year-old female with thoracic spinal cord lesion.
Nonspecific cerebral white matter changes. Subsequent encounter.

EXAM:
MRI HEAD WITHOUT AND WITH CONTRAST
TECHNIQUE: Multiplanar, multiecho pulse sequences of the brain and surrounding
structures were obtained without and with intravenous contrast.
CONTRAST:  15 mL MultiHance in conjunction with contrast enhanced
imaging of the cervical and thoracic spine reported separately.

[Series 2: DWI · axial · 3.0mm · 2.19mm/px · z∈[-54,+91]mm · 9 of 90 slices shown (1 of 2)]
[im 1/90]
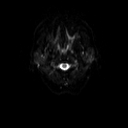
[im 12/90]
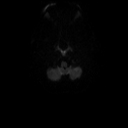
[im 23/90]
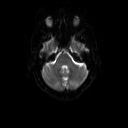
[im 34/90]
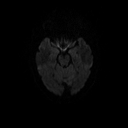
[im 45/90]
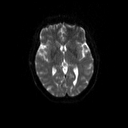
[im 56/90]
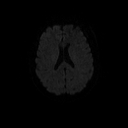
[im 67/90]
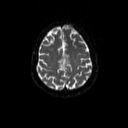
[im 78/90]
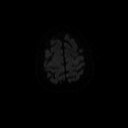
[im 90/90]
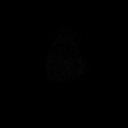

[Series 3: DWI · axial · 3.0mm · 2.19mm/px · z∈[-54,+91]mm · 5 of 45 slices shown (2 of 2)]
[im 1/45]
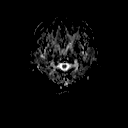
[im 12/45]
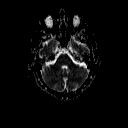
[im 23/45]
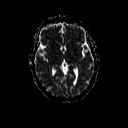
[im 34/45]
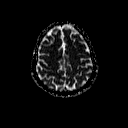
[im 45/45]
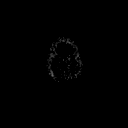

[Series 4: T1 · sagittal · 5.0mm · 0.47mm/px · 2 of 21 slices shown]
[im 1/21]
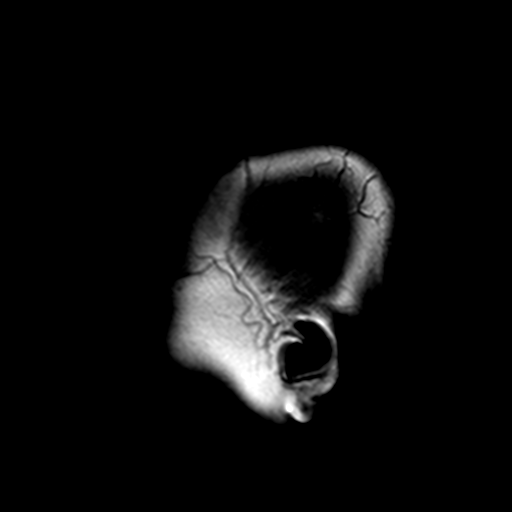
[im 21/21]
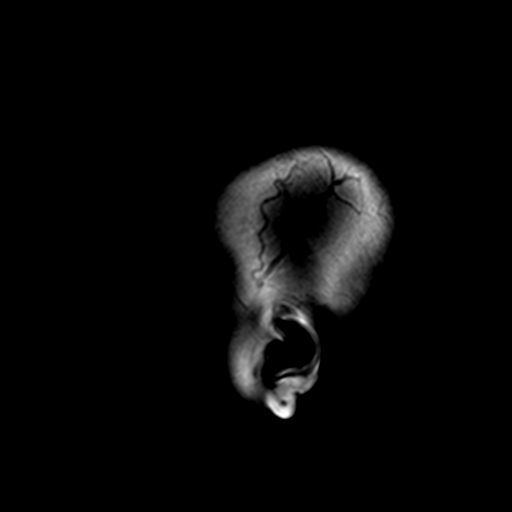

[Series 5: FLAIR · sagittal · 4.0mm · 0.47mm/px · 3 of 30 slices shown (1 of 2)]
[im 1/30]
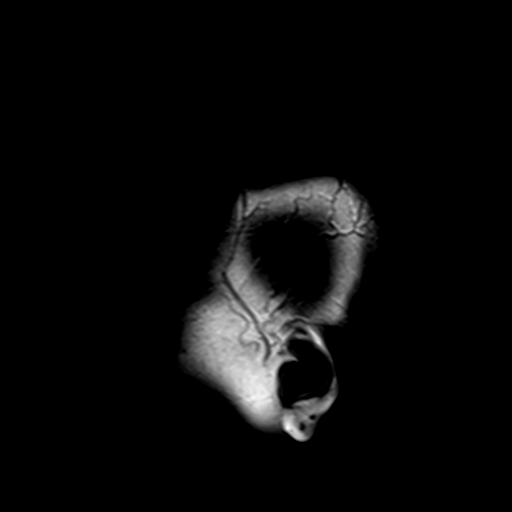
[im 15/30]
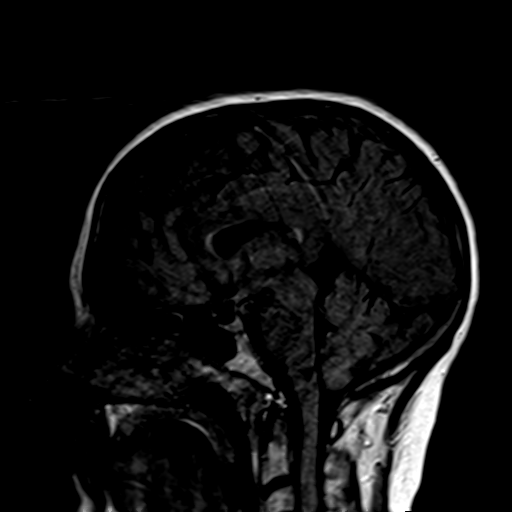
[im 30/30]
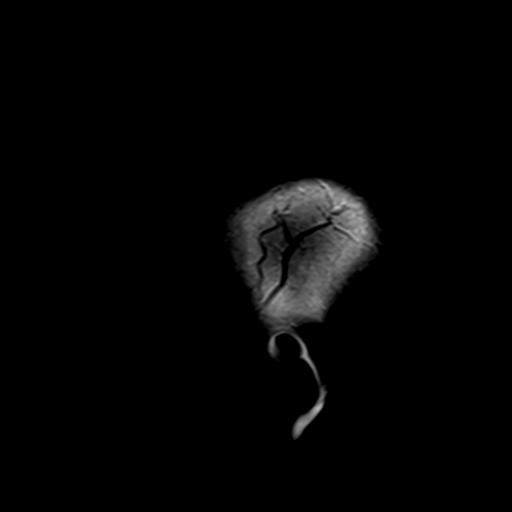

[Series 6: FLAIR · axial · 5.0mm · 0.45mm/px · z∈[-81,+71]mm · 3 of 23 slices shown (2 of 2)]
[im 1/23]
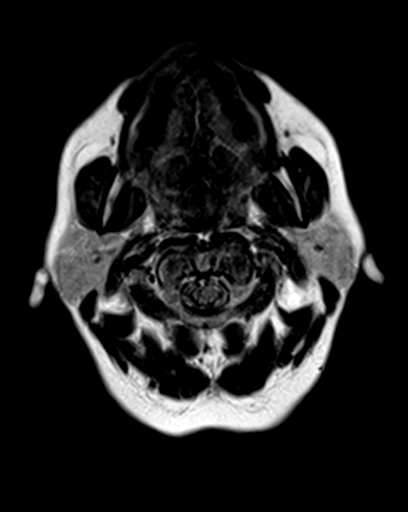
[im 12/23]
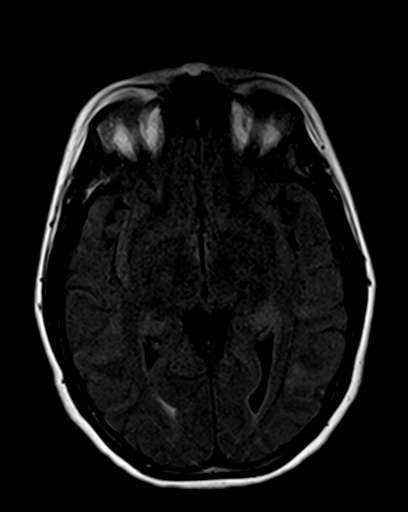
[im 23/23]
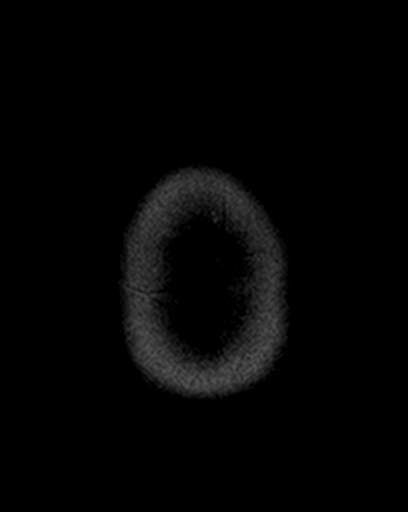

[Series 7: T2 · axial · 5.0mm · 0.45mm/px · z∈[-81,+71]mm · 3 of 23 slices shown (1 of 3)]
[im 1/23]
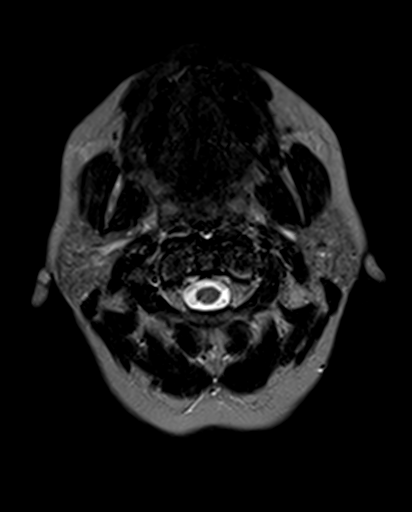
[im 12/23]
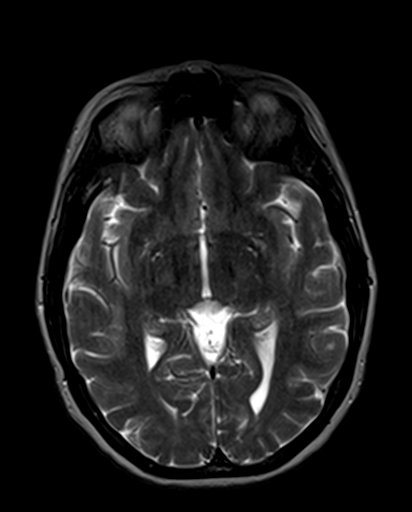
[im 23/23]
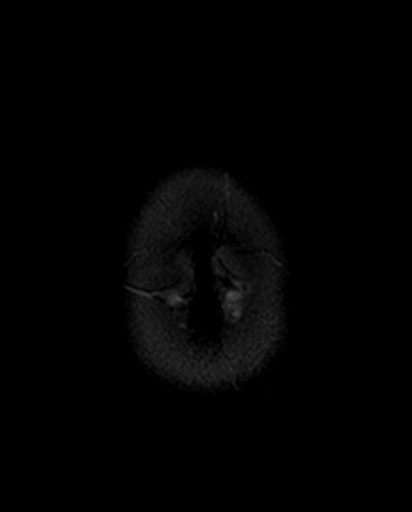

[Series 8: T2 · axial · 5.0mm · 0.45mm/px · z∈[-81,+71]mm · 3 of 23 slices shown (2 of 3)]
[im 1/23]
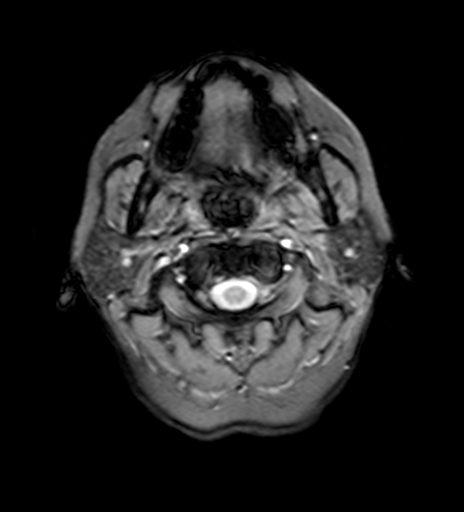
[im 12/23]
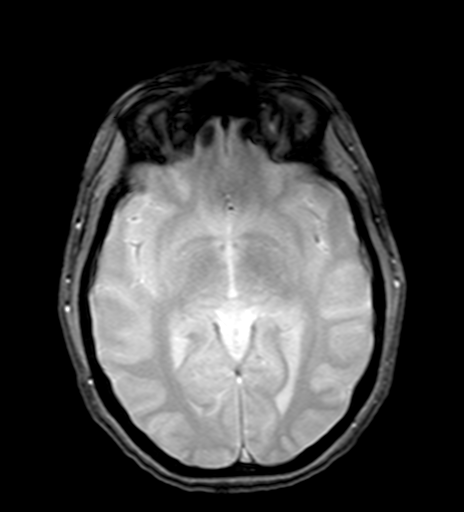
[im 23/23]
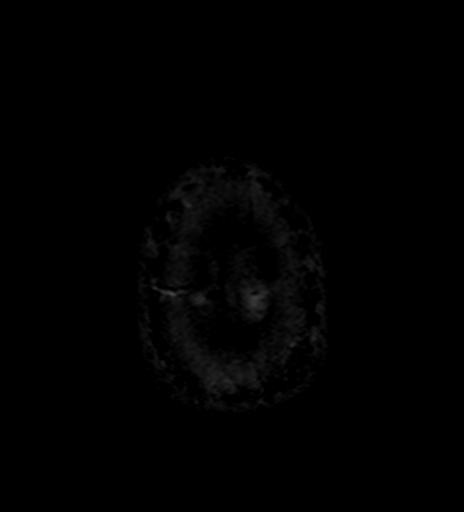

[Series 9: T2 · coronal · 5.0mm · 0.47mm/px · 3 of 26 slices shown (3 of 3)]
[im 1/26]
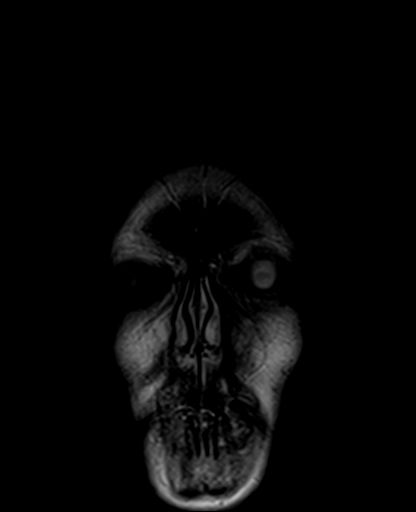
[im 13/26]
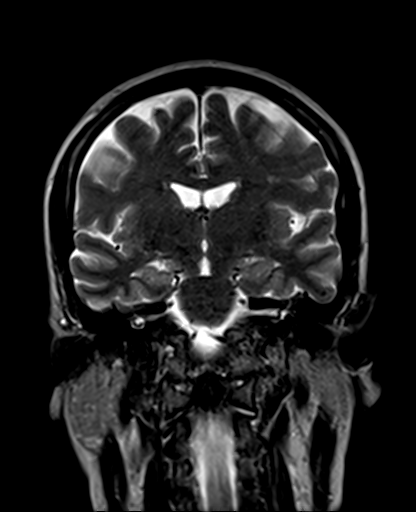
[im 26/26]
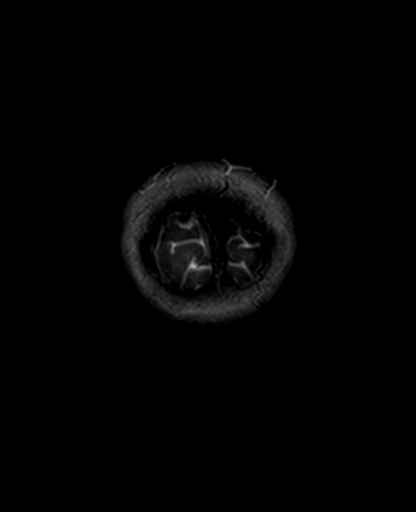

[Series 13: T1 post-contrast · coronal · 5.0mm · 0.47mm/px · 3 of 26 slices shown]
[im 1/26]
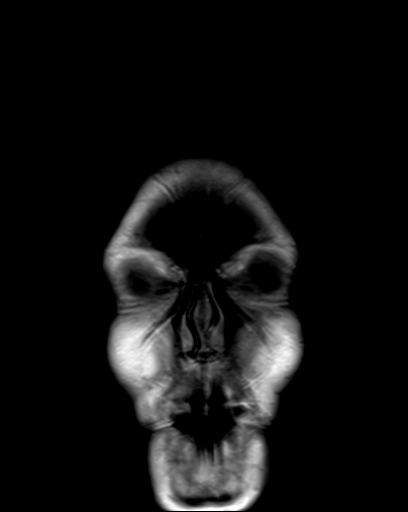
[im 13/26]
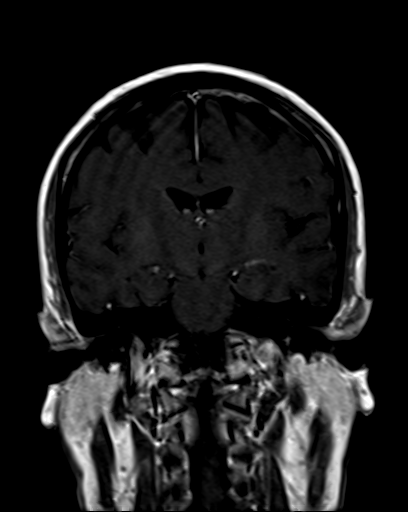
[im 26/26]
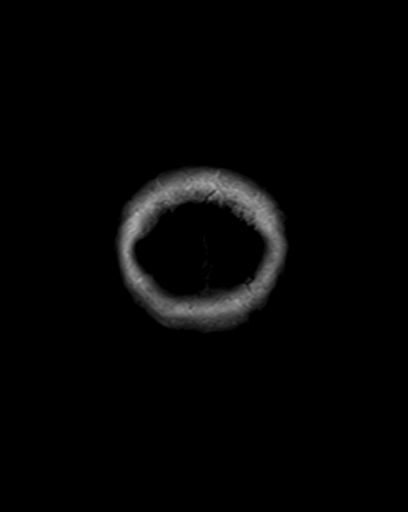

[34 of 48 positions shown; findings below may reference images not displayed]

FINDINGS: Major intracranial vascular flow voids are stable and within normal
limits. Cerebral volume is stable and within normal limits. No
restricted diffusion to suggest acute infarction. No midline shift,
mass effect, evidence of mass lesion, ventriculomegaly, extra-axial
collection or acute intracranial hemorrhage. Cervicomedullary
junction and pituitary are within normal limits.

No cortical encephalomalacia. Scattered small nonspecific cerebral
white matter T2 and FLAIR hyperintense lesions are unchanged since
April 2015 and remain nonenhancing. No chronic cerebral blood
products. Deep gray matter nuclei, brainstem, and cerebellum remain
normal. No dural thickening.

Visible internal auditory structures appear normal. Mastoids remain
clear trace left maxillary and right frontal sinus mucosal
thickening today. Orbit and scalp soft tissues appear normal.
Visualized bone marrow signal is within normal limits. Cervical
spine findings today reported separately.
IMPRESSION: 1. Unchanged MRI appearance of the brain since April 2015 remarkable
only for minimal nonspecific, nonenhancing cerebral white matter
signal changes.
2. Cervical and thoracic spine MRI today reported separately.

## 2017-12-21 ENCOUNTER — Other Ambulatory Visit: Payer: Self-pay | Admitting: Neurology

## 2018-01-01 ENCOUNTER — Other Ambulatory Visit: Payer: Self-pay

## 2018-01-01 ENCOUNTER — Encounter: Payer: Self-pay | Admitting: Neurology

## 2018-01-01 ENCOUNTER — Ambulatory Visit (INDEPENDENT_AMBULATORY_CARE_PROVIDER_SITE_OTHER): Payer: Medicare HMO | Admitting: Neurology

## 2018-01-01 DIAGNOSIS — R269 Unspecified abnormalities of gait and mobility: Secondary | ICD-10-CM | POA: Diagnosis not present

## 2018-01-01 DIAGNOSIS — R609 Edema, unspecified: Secondary | ICD-10-CM | POA: Diagnosis not present

## 2018-01-01 DIAGNOSIS — M4802 Spinal stenosis, cervical region: Secondary | ICD-10-CM

## 2018-01-01 DIAGNOSIS — F411 Generalized anxiety disorder: Secondary | ICD-10-CM

## 2018-01-01 DIAGNOSIS — R208 Other disturbances of skin sensation: Secondary | ICD-10-CM

## 2018-01-01 DIAGNOSIS — N319 Neuromuscular dysfunction of bladder, unspecified: Secondary | ICD-10-CM

## 2018-01-01 DIAGNOSIS — G373 Acute transverse myelitis in demyelinating disease of central nervous system: Secondary | ICD-10-CM | POA: Diagnosis not present

## 2018-01-01 MED ORDER — TRAMADOL HCL 50 MG PO TABS: 50.0000 mg | ORAL_TABLET | Freq: Three times a day (TID) | ORAL | 1 refills | Status: DC | PRN

## 2018-01-01 NOTE — Progress Notes (Signed)
[facility]  PATIENT: Bridget [name] DOB: 06-Jul-1966  REFERRING DOCTOR OR PCP:  Dr. Tomi Likens [facility]).   PCP is Heide Scales SOURCE: Patient, notes from Dr. Tomi Likens, MRI reports and images on PACS, lab reports  _________________________________   HISTORICAL  CHIEF COMPLAINT:  Chief Complaint  Patient presents with  . Hx. of Transverse Myelitis.    Sts. she has noted numbness in mid back onset few days ago.  C/O worsening lbp,bilat leg pain, edema bilat feet, lower legs, but she cancelled her NS consult, and never f/u with [facility] in [place]  . Back Pain    HISTORY OF PRESENT ILLNESS:  Bridget [name] Orpah Greek) Is a 52 year old woman who has had 2 episodes of transverse myelitis in 2016 and 2017  Update 01/01/2018: She had transverse myelitis x2 in 2016 and 2017 at 2 different levels.  MRI of the brain only shows a few nonspecific foci.  Recent cervical spine MRIs did not show any new transverse myelitis however, she does have moderate to severe spinal stenosis at C4-C5 and C5-C6.  After her MRI last year, we referred her to neurosurgery.  She instead preferred to go to the spine Center in [place].  She reports that she did go there and that they did another MRI of the month later and recommended an epidural steroid injection.  She does not have much neck pain but has reduced ROM.   No severe radiating pain.      In her lower back she gets a lot of back spasms.      She also notes lower back pain and has edema in her ankles, all worse the past couple months.    She notes leg weakness, right > left.   The weakness seemed to get worse after the edema started in the ankles.   She went to [facility] ED 11/12/17; [facility] 12/09/2017 and [facility] last week (we don't have these records).   A leg venous doppler reportedly did not show any clots.     She reports a history of thyroid disease and was on medication but she stopped when she ran out.  She has not been on any  medication for at least 6 months.        She has had dysesthesias in her legs since the transverse myelitis.   She could not tolerate Lyrica.   Lamotrigine and oxcabezapine had not helped pain in the past.    MRI of the cervical and thoracic spine and brain on 03/12/2017 shows the old focus at T4.  The cervical spine showed a normal spinal cord but she is does have moderate spinal stenosis at C4-C5 and severe spinal stenosis at C5-C6, both levels with foraminal narrowing.  Her PCP is Dr. Clement Husbands.  From 02/18/2017: TM:   She feels that her walking is worsening. She had transverse myelitis twice, initially in 2016 and again in 2017 at 2 different levels in the thoracic spine.   MRI of the brain shows a few foci that are nonspecific and have been stable. Initial neuromyelitis optica antibody is negative.  She is experiencing more pain recently..  I reviewed images from her MRI of the brain dated 05/14/2016 showed a few small subcortical and periventricular white matter foci but no change when compared to the previous MRI from 2017. The foci are nonspecific.  LBP/spasms/jerking:    She is having more lower back pain and spasms/jerks.    She is doing some PT but has  a lot of difficulty getting comfortable.   She was advised not to sit in a recliner and to try to exercise more   They are going to consider a longer brace (to include her knee)  She gets pain in her lower back, worse if she sits a while or lays flat.  She gets jerking in her legs and often falls.   The jerking is throughout the day but worse if she is drowsy.   A couple weeks ago she went to [facility]. An x-ray of the back just showed minimal degenerative changes at L3-L4 and L4-L5. She has a known disc protrusion at L4-L5 on a 2016 MRI but no definite nerve root compression.  Gait/strength:   There is weakness and spasticity in her legs, left worse than right. She has a footdrop on the left.         She feels her balance is poor and she  does not feel steady if she is not holding something when she stands up.      Arms are fine.  Dysesthesia:   She has dysesthetic pain with allodynia in the left flank and upper leg region.  She has more allodynia without resting dysesthesia in the right leg.    Lyrica and lanmotrigine did not help.   Gabapentin caused cognitive changes and she stopped         Vision:    She notes that there is some visual blurring and reading is difficult. She does not wear glasses.   Bladder:    She has reports urinary frequency, urgency and frequent incontinence. She has moderate constipation.   History of transverse myelitis/possible MS:   In 2013, while in [place], she fell and had numbness below the waist and had severe leg weakness.  She did not have problems with her bladder.    She went to [facility]) in [place] and [place] airlifted to [facility] and was hospitalized x 7 days.  She had MRIs and an LP in [place] but does not know results.   She had PT and improved by discharge and did additional Rehab at home and at [facility]    She improved almost to baseline, walking independently but having problems going downhill or downstairs.    In 2016, she had the gradual worsening in gait and had a more sudden change in July with much more difficulty with gait and numbness.    She saw Dr, Arnoldo Morale at [facility].     She had MRI's of the spine performed and saw Neurosurgery.   She was referred to Dr. Tomi Likens who saw her.   She improved a little bit so she did not need to use the walker as much.    She also had urinary incontinence.     In March 2017,  she had worsening of gait, numbness and bladder function.  A repeat MRI of the thoracic spine showed another enhancing focus, distinct from the first one.   She underwent a lumbar puncture and the CSF was reportedly normal.    Anti-NMO Ab was borderline.    I saw her a few weeks later.   Repeat NMO-Ab was negative.      I  have reviewed multiple MRIs. The MRI of the brain from 04/13/2015 and 12/30/2014 just showed 2-3 small T2/FLAIR hyperintense foci that are very nonspecific. MRI of the thoracic spine 02/23/2015 shows a lesion with edema at T2-T4.    MRI 09/20/2015 showed  interval resolution of that focus. MRI of the thoracic spine 12/30/2015 shows a different enhancing lesion adjacent to T5-T6 associated with edema.  MRI of the cervical spine shows a normal spinal cord but she has significant foraminal narrowing at C5-C6 and C6-C7 due to degenerative changes.    I have also reviewed some laboratory tests. The NMO antibody was borderline at 1.6 initially but then was < 1.5 at second check.      She had a lumbar puncture for 01/09/2016. Although the actual results are not in the EMR there is a reference to the spinal fluid being normal on a telephone note.   An additional MRI of the brain in August 2017 showed no new lesions.Marland Kitchen    REVIEW OF SYSTEMS: Constitutional: No fevers, chills, sweats, or change in appetite Eyes: No visual changes, double vision, eye pain Ear, nose and throat: No hearing loss, ear pain, nasal congestion, sore throat Cardiovascular: No chest pain, palpitations Respiratory: No shortness of breath at rest or with exertion.   No wheezes GastrointestinaI: No nausea, vomiting, diarrhea, abdominal pain, fecal incontinence Genitourinary: No dysuria, urinary retention or frequency.  No nocturia. Musculoskeletal: No neck pain, back pain Integumentary: No rash, pruritus, skin lesions Neurological: as above Psychiatric: No depression at this time.  No anxiety Endocrine: No palpitations, diaphoresis, change in appetite, change in weigh or increased thirst Hematologic/Lymphatic: No anemia, purpura, petechiae. Allergic/Immunologic: No itchy/runny eyes, nasal congestion, recent allergic reactions, rashes  ALLERGIES: Allergies  Allergen Reactions  . Penicillins Anaphylaxis, Rash and Swelling    Throat  swelling Swelling/throat closes   . Amoxicillin Swelling and Other (See Comments)    Unknown Unknown Eyes swelling  . Latex Other (See Comments) and Rash    Rash and blisters from powder in glove Rash and blisters from powder in glove  . Shellfish-Derived Products Itching  . Penicillin G Other (See Comments)    Unknown  . Celecoxib Itching  . Iodine-131 Rash    HOME MEDICATIONS:  Current Outpatient Medications:  .  diazepam (VALIUM) 5 MG tablet, TAKE 1 TABLET BY MOUTH EVERY 8 HOURS AS NEEDED FOR ANXIETY, Disp: 90 tablet, Rfl: 5 .  diazepam (VALIUM) 5 MG tablet, Take 5 mg by mouth., Disp: , Rfl:  .  diazepam (VALIUM) 5 MG tablet, TAKE 1 TABLET BY MOUTH EVERY 8 HOURS AS NEEDED FOR ANXIETY, Disp: 90 tablet, Rfl: 0 .  hydrochlorothiazide (HYDRODIURIL) 12.5 MG tablet, Take 12.5 mg by mouth., Disp: , Rfl:  .  lovastatin (MEVACOR) 20 MG tablet, , Disp: , Rfl:  .  meclizine (ANTIVERT) 25 MG tablet, TAKE 1 TABLET BY MOUTH 4 TIMES A DAY FOR 7 DAYS, Disp: , Rfl: 0 .  meloxicam (MOBIC) 7.5 MG tablet, Take 7.5 mg by mouth 2 (two) times daily., Disp: , Rfl:  .  mirabegron ER (MYRBETRIQ) 50 MG TB24 tablet, Take 50 mg by mouth., Disp: , Rfl:  .  nitrofurantoin, macrocrystal-monohydrate, (MACROBID) 100 MG capsule, TAKE 1 CAPSULE (100 MG TOTAL) BY MOUTH 2 (TWO) TIMES DAILY., Disp: 14 capsule, Rfl: 0 .  omeprazole (PRILOSEC) 40 MG capsule, Take 40 mg by mouth., Disp: , Rfl:  .  ondansetron (ZOFRAN) 4 MG tablet, Take 4 mg by mouth., Disp: , Rfl:  .  oxybutynin (DITROPAN) 5 MG tablet, TAKE 1 TABLET (5 MG TOTAL) BY MOUTH 2 (TWO) TIMES DAILY., Disp: 60 tablet, Rfl: 8 .  promethazine (PHENERGAN) 25 MG tablet, Take 25 mg by mouth every 6 (six) hours as needed for nausea or  vomiting., Disp: , Rfl:  .  traMADol (ULTRAM) 50 MG tablet, Take 1 tablet (50 mg total) by mouth 3 (three) times daily as needed., Disp: 90 tablet, Rfl: 1 .  TRIMETHOPRIM PO, Take 100 mg by mouth daily., Disp: , Rfl:  .  baclofen  (LIORESAL) 10 MG tablet, Take 1 tablet (10 mg total) by mouth 3 (three) times daily. (Patient not taking: Reported on 07/17/2017), Disp: 90 each, Rfl: 5 .  esomeprazole (NEXIUM) 20 MG capsule, Take 20 mg by mouth., Disp: , Rfl:  .  ibuprofen (ADVIL,MOTRIN) 800 MG tablet, Take 800 mg by mouth., Disp: , Rfl:  .  sertraline (ZOLOFT) 50 MG tablet, Take 50 mg by mouth., Disp: , Rfl:   PAST MEDICAL HISTORY: Past Medical History:  Diagnosis Date  . Back pain   . Neuropathy   . Spinal stenosis     PAST SURGICAL HISTORY: Past Surgical History:  Procedure Laterality Date  . BREAST LUMPECTOMY Right    benign  . CHOLECYSTECTOMY    . TUBAL LIGATION      FAMILY HISTORY: Family History  Problem Relation Age of Onset  . Kidney cancer Mother     SOCIAL HISTORY:  Social History   Socioeconomic History  . Marital status: Divorced    Spouse name: Not on file  . Number of children: Not on file  . Years of education: Not on file  . Highest education level: Not on file  Occupational History  . Not on file  Social Needs  . Financial resource strain: Not on file  . Food insecurity:    Worry: Not on file    Inability: Not on file  . Transportation needs:    Medical: Not on file    Non-medical: Not on file  Tobacco Use  . Smoking status: Current Every Day Smoker    Packs/day: 0.50    Types: Cigarettes  . Smokeless tobacco: Never Used  Substance and Sexual Activity  . Alcohol use: No    Alcohol/week: 0.0 oz  . Drug use: No  . Sexual activity: Not on file  Lifestyle  . Physical activity:    Days per week: Not on file    Minutes per session: Not on file  . Stress: Not on file  Relationships  . Social connections:    Talks on phone: Not on file    Gets together: Not on file    Attends religious service: Not on file    Active member of club or organization: Not on file    Attends meetings of clubs or organizations: Not on file    Relationship status: Not on file  . Intimate  partner violence:    Fear of current or ex partner: Not on file    Emotionally abused: Not on file    Physically abused: Not on file    Forced sexual activity: Not on file  Other Topics Concern  . Not on file  Social History Narrative   ** Merged History Encounter **   Lives with boyfriend, Adriana Mccallum.  Education 12th grade diploma.  Unemployed.  2 children.     2 cups coffee daily.         PHYSICAL EXAM  There were no vitals filed for this visit.  There is no height or weight on file to calculate BMI.   General: The patient is well-developed and well-nourished and in no acute distress she has.   Moderate pitting edema at the ankles.  Musculoskeletal:  Back  is nontender  Neurologic Exam  Mental status: The patient is alert and oriented x 3 at the time of the examination. The patient has apparent normal recent and remote memory, with an apparently normal attention span and concentration ability.   Speech is normal.  Cranial nerves: Extraocular movements are full. Facial sense and strength is normal.  Trapezius and sternocleidomastoid strength is normal. No dysarthria is noted.  The tongue is midline, and the patient has symmetric elevation of the soft palate. No obvious hearing deficits are noted.  Motor:  Muscle bulk is normal.   Tone is increased in left > right legs.   Strength is  4+ / 5 in legs, slightly worse on the left.    Sensory: Sensory testing is intact to pinprick, soft touch and vibration sensation in arms.  She has a mid thoracic sensory level with a reduced touch on the right and temperature and touch in the legs, worse on the left.  She has painful allodynia in both feet to touch but not to vibration.  Coordination: Cerebellar testing reveals good finger-nose-finger but reduced heel-to-shin bilaterally.  Gait and station: Station is normal.   The gait is spastic and wide in these bilateral support.     Romberg is positive.   Reflexes: Deep tendon reflexes  are normal in the arms.  DTRs are increased in the legs with spread at the knees and nonsustained clonus at the ankles.    DIAGNOSTIC DATA (LABS, IMAGING, TESTING) - I reviewed patient records, labs, notes, testing and imaging myself where available.   Lab Results  Component Value Date   TSH 0.20 (L) 01/18/2016        ASSESSMENT AND PLAN  Edema, unspecified type - Plan: Comprehensive metabolic panel, Thyroid Panel With TSH, ECHOCARDIOGRAM COMPLETE  Transverse myelitis (HCC)  Bladder neurogenesis  Gait disturbance  Dysesthesia  Anxiety state  Cervical spinal stenosis   1.  Her gait is a little bit worse but she is having much more pain in her feet.  She has known history of transverse myelitis and cervical stenosis.  I advised her to follow-up with the spine surgeon that she had seen previously.    continue baclofen and diazepam for spasticity. 2.  Check thyroid panel with TSH, CMP. 3.   Add tramadol for pain.   4.   I do not know why she has s such edema in her feet and ankles.  I would have her get an echocardiogram and check some lab work.  She is advised to follow-up with her primary care physician.   She will return to see me in 3-4 months or sooner if there are new or worsening neurologic symptoms.  45-minute face-to-face evaluation with greater than one half the time counseling coordinating care about her worsening pain and gait issues. Richard A. Felecia Shelling, MD, PhD 5/91/6384, 66:59 PM Certified in Neurology, Clinical Neurophysiology, Sleep Medicine, Pain Medicine and Neuroimaging  [facility] [address], [place], [place] [address] [phone]

## 2018-01-02 ENCOUNTER — Other Ambulatory Visit: Payer: Self-pay | Admitting: Neurology

## 2018-01-02 LAB — COMPREHENSIVE METABOLIC PANEL
ALT: 15 IU/L (ref 0–32)
AST: 15 IU/L (ref 0–40)
Albumin/Globulin Ratio: 1.6 (ref 1.2–2.2)
Albumin: 4.4 g/dL (ref 3.5–5.5)
Alkaline Phosphatase: 68 IU/L (ref 39–117)
BUN/Creatinine Ratio: 17 (ref 9–23)
BUN: 16 mg/dL (ref 6–24)
Bilirubin Total: 0.3 mg/dL (ref 0.0–1.2)
CO2: 22 mmol/L (ref 20–29)
Calcium: 9.6 mg/dL (ref 8.7–10.2)
Chloride: 105 mmol/L (ref 96–106)
Creatinine, Ser: 0.92 mg/dL (ref 0.57–1.00)
GFR calc Af Amer: 84 mL/min/{1.73_m2} (ref >59)
GFR calc non Af Amer: 73 mL/min/{1.73_m2} (ref >59)
Globulin, Total: 2.7 g/dL (ref 1.5–4.5)
Glucose: 98 mg/dL (ref 65–99)
Potassium: 4.2 mmol/L (ref 3.5–5.2)
Sodium: 143 mmol/L (ref 134–144)
Total Protein: 7.1 g/dL (ref 6.0–8.5)

## 2018-01-02 LAB — THYROID PANEL WITH TSH
Free Thyroxine Index: 1.8 (ref 1.2–4.9)
T3 Uptake Ratio: 26 % (ref 24–39)
T4, Total: 7.1 ug/dL (ref 4.5–12.0)
TSH: 0.288 u[IU]/mL — ABNORMAL LOW (ref 0.450–4.500)

## 2018-01-05 ENCOUNTER — Telehealth: Payer: Self-pay | Admitting: *Deleted

## 2018-01-05 NOTE — Telephone Encounter (Signed)
-----   Message from Britt Bottom, MD sent at 01/02/2018  2:20 PM EDT ----- Please let her know that the lab work was okay.  Her one thyroid level was a little off which would be consistent with mild hypethyroidism but the other thyroid tests were fine so this is very unlikely to be causing any of her symptoms.  We will let her know the results of the echocardiogram.  Because of the worsening edema, she needs to follow-up with her primary care doctor.

## 2018-01-05 NOTE — Telephone Encounter (Signed)
LMOM with below lab results.  She does not need to return this call unless she has questions/fim 

## 2018-01-21 NOTE — Telephone Encounter (Signed)
Patient calling to discuss further her thyroid results.

## 2018-01-21 NOTE — Telephone Encounter (Signed)
Spoke with Bridget [name] and reviewed lab results with her. (Normal cmp, and thyroid panel which showed low tsh but other thyroid labs were normal).  She verbalized understanding of same/fim

## 2018-01-28 NOTE — Telephone Encounter (Signed)
Spoke with pt. and empathized with her; however RAS is not sure why she has edema in both feet; he does not feel this is related to a neurologic condition.  I rec. she f/u with pcp.  She verbalized understanding of same/fim

## 2018-01-28 NOTE — Telephone Encounter (Signed)
Pt has called to inform RN and Dr Felecia Shelling that she has completed all her medication and her feet are still swollen and she is in pain.  Pt states she has tried compression socks(which have torn), she has tried wraps and also "pee pills " none have worked, pt states it hurts when she walks.  Pt is asking to be called as to what can be done

## 2018-02-01 ENCOUNTER — Other Ambulatory Visit: Payer: Self-pay | Admitting: Neurology

## 2018-02-05 NOTE — Telephone Encounter (Signed)
Pt requesting her lab result be faxed to Dr. Laroy Apple office fax # [phone] if possible

## 2018-02-05 NOTE — Telephone Encounter (Signed)
Lab results from 01/01/18 faxed to Dr. Creig Hines office as requested, with fax confirmation received/fim

## 2018-03-24 ENCOUNTER — Other Ambulatory Visit: Payer: Self-pay | Admitting: Neurology

## 2018-04-07 ENCOUNTER — Emergency Department (HOSPITAL_COMMUNITY)
Admission: EM | Admit: 2018-04-07 | Discharge: 2018-04-08 | Disposition: A | Discharge status: Home / Self Care | Payer: Medicare Other | Attending: Emergency Medicine | Admitting: Emergency Medicine

## 2018-04-07 ENCOUNTER — Encounter (HOSPITAL_COMMUNITY): Payer: Self-pay | Admitting: Emergency Medicine

## 2018-04-07 ENCOUNTER — Other Ambulatory Visit: Payer: Self-pay

## 2018-04-07 DIAGNOSIS — Z91040 Latex allergy status: Secondary | ICD-10-CM | POA: Diagnosis not present

## 2018-04-07 DIAGNOSIS — F17210 Nicotine dependence, cigarettes, uncomplicated: Secondary | ICD-10-CM | POA: Diagnosis not present

## 2018-04-07 DIAGNOSIS — R2243 Localized swelling, mass and lump, lower limb, bilateral: Secondary | ICD-10-CM | POA: Diagnosis present

## 2018-04-07 DIAGNOSIS — Z79899 Other long term (current) drug therapy: Secondary | ICD-10-CM | POA: Diagnosis not present

## 2018-04-07 DIAGNOSIS — R609 Edema, unspecified: Secondary | ICD-10-CM

## 2018-04-07 NOTE — ED Triage Notes (Signed)
Pt arriving with bilateral foot swelling that began yesterday. Pt states she is unable to walk due to swelling and pain. Pt has taken BC and diazepam prior to arrival.

## 2018-04-08 ENCOUNTER — Emergency Department (HOSPITAL_COMMUNITY): Payer: Medicare Other

## 2018-04-08 DIAGNOSIS — R609 Edema, unspecified: Secondary | ICD-10-CM | POA: Diagnosis not present

## 2018-04-08 LAB — COMPREHENSIVE METABOLIC PANEL
ALT: 17 U/L (ref 0–44)
AST: 18 U/L (ref 15–41)
Albumin: 4 g/dL (ref 3.5–5.0)
Alkaline Phosphatase: 61 U/L (ref 38–126)
Anion gap: 8 (ref 5–15)
BUN: 12 mg/dL (ref 6–20)
CO2: 25 mmol/L (ref 22–32)
Calcium: 9.3 mg/dL (ref 8.9–10.3)
Chloride: 109 mmol/L (ref 98–111)
Creatinine, Ser: 0.88 mg/dL (ref 0.44–1.00)
GFR calc Af Amer: >60 mL/min (ref >60)
GFR calc non Af Amer: >60 mL/min (ref >60)
Glucose, Bld: 104 mg/dL — ABNORMAL HIGH (ref 70–99)
Potassium: 3.8 mmol/L (ref 3.5–5.1)
Sodium: 142 mmol/L (ref 135–145)
Total Bilirubin: 0.2 mg/dL — ABNORMAL LOW (ref 0.3–1.2)
Total Protein: 7 g/dL (ref 6.5–8.1)

## 2018-04-08 LAB — URINALYSIS, ROUTINE W REFLEX MICROSCOPIC
Bacteria, UA: NONE SEEN
Bilirubin Urine: NEGATIVE
Glucose, UA: NEGATIVE mg/dL
Hgb urine dipstick: NEGATIVE
Ketones, ur: NEGATIVE mg/dL
Leukocytes, UA: NEGATIVE
Nitrite: NEGATIVE
Protein, ur: NEGATIVE mg/dL
Specific Gravity, Urine: 1.015 (ref 1.005–1.030)
pH: 6 (ref 5.0–8.0)

## 2018-04-08 LAB — CBC WITH DIFFERENTIAL/PLATELET
Basophils Absolute: 0 10*3/uL (ref 0.0–0.1)
Basophils Relative: 0 %
Eosinophils Absolute: 0.2 10*3/uL (ref 0.0–0.7)
Eosinophils Relative: 3 %
HCT: 40.1 % (ref 36.0–46.0)
Hemoglobin: 13.9 g/dL (ref 12.0–15.0)
Lymphocytes Relative: 51 %
Lymphs Abs: 3.4 10*3/uL (ref 0.7–4.0)
MCH: 32 pg (ref 26.0–34.0)
MCHC: 34.7 g/dL (ref 30.0–36.0)
MCV: 92.2 fL (ref 78.0–100.0)
Monocytes Absolute: 0.3 10*3/uL (ref 0.1–1.0)
Monocytes Relative: 5 %
Neutro Abs: 2.7 10*3/uL (ref 1.7–7.7)
Neutrophils Relative %: 41 %
Platelets: 225 10*3/uL (ref 150–400)
RBC: 4.35 MIL/uL (ref 3.87–5.11)
RDW: 13 % (ref 11.5–15.5)
WBC: 6.7 10*3/uL (ref 4.0–10.5)

## 2018-04-08 LAB — BRAIN NATRIURETIC PEPTIDE: B Natriuretic Peptide: 29.2 pg/mL (ref 0.0–100.0)

## 2018-04-08 MED ORDER — FUROSEMIDE 20 MG PO TABS: 20.0000 mg | ORAL_TABLET | Freq: Every day | ORAL | 0 refills | Status: DC

## 2018-04-08 MED ORDER — HYDROMORPHONE HCL 1 MG/ML IJ SOLN
1.0000 mg | Freq: Once | INTRAMUSCULAR | Status: AC
  Administered 2018-04-08: 1 mg via INTRAVENOUS
  Filled 2018-04-08: qty 1

## 2018-04-08 MED ORDER — HYDROCODONE-ACETAMINOPHEN 5-325 MG PO TABS: 1.0000 | ORAL_TABLET | Freq: Four times a day (QID) | ORAL | 0 refills | Status: DC | PRN

## 2018-04-08 NOTE — ED Provider Notes (Signed)
[facility] DEPT Provider Note   CSN: [id number] Arrival date & time: 04/07/18  2253     History   Chief Complaint Chief Complaint  Patient presents with  . Foot Swelling    bilateral    HPI Bridget [name] is a 52 y.o. female.  HPI Patient presents to the emergency department with bilateral lower extremity swelling.  The patient states getting worse over the last 2 days.  She states she has had this happen previously in the past.  The patient states that nothing seems make the condition better but ambulation and pressure make the pain worse.  Patient states that she does not have any other associated symptoms at this time.  She states she has had some bladder issues along with kidney related issues in the past.  The patient denies chest pain, shortness of breath, headache,blurred vision, neck pain, fever, cough, weakness, numbness, dizziness, anorexia,  abdominal pain, nausea, vomiting, diarrhea, rash, back pain, dysuria, hematemesis, bloody stool, near syncope, or syncope. Past Medical History:  Diagnosis Date  . Back pain   . Neuropathy   . Spinal stenosis     Patient Active Problem List   Diagnosis Date Noted  . Edema 01/01/2018  . Cervical spinal stenosis 01/01/2018  . Gait disturbance 02/14/2016  . Dysesthesia 02/14/2016  . Anxiety state 02/14/2016  . Spondylosis with myelopathy, 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/09/2016  . Female genuine stress incontinence 01/09/2016  . 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 Peripheral nerve disease 01/09/2016  . Smokes tobacco daily 11/15/2015  . Excessive and frequent menstruation with irregular cycle 08/14/2015  . Hyperthyroidism, subclinical 07/19/2015  . Beat, premature ventricular 07/19/2015  . Awareness of heartbeats 07/05/2015  . Dizziness 07/05/2015  . Cardiac murmur 07/05/2015  . Spinal cord lesion ([place]) 06/07/2015  . Disease of spinal cord ([place]) 06/07/2015  . Transverse myelitis ([place]) 06/06/2014  .  Neurogenic bowel 06/06/2014  . 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 Lumbar radiculopathy 06/06/2014  . B-complex deficiency 06/22/2012  . Cephalalgia 06/16/2012  . Clinical depression 06/16/2012    Past Surgical History:  Procedure Laterality Date  . BREAST LUMPECTOMY Right    benign  . CHOLECYSTECTOMY    . TUBAL LIGATION       OB History    Gravida  0   Para  0   Term  0   Preterm  0   AB  0   Living        SAB  0   TAB  0   Ectopic  0   Multiple      Live Births               Home Medications    Prior to Admission medications   Medication Sig Start Date End Date Taking? Authorizing Provider  BREO ELLIPTA 100-25 MCG/INH AEPB Take 1 Dose by mouth daily. 03/08/18  Yes [provider]  budesonide-formoterol (SYMBICORT) 160-4.5 MCG/ACT inhaler Inhale 2 puffs into the lungs 2 (two) times daily.   Yes [provider]  diazepam (VALIUM) 5 MG tablet TAKE 1 TABLET BY MOUTH EVERY 8 HOURS AS NEEDED FOR ANXIETY 03/24/18  Yes Sater, Nanine Means, MD  esomeprazole (NEXIUM) 20 MG capsule Take 20 mg by mouth.   Yes [provider]  ibuprofen (ADVIL,MOTRIN) 800 MG tablet Take 800 mg by mouth. 12/01/16  Yes [provider]  lovastatin (MEVACOR) 20 MG tablet Take 20 mg by mouth every morning.  02/04/17  Yes [provider]  meclizine (ANTIVERT) 25 MG tablet TAKE 1 TABLET BY MOUTH 4 TIMES A DAY FOR 7 DAYS 05/24/15  Yes [provider]  ondansetron (ZOFRAN) 4 MG tablet Take 4 mg by mouth.   Yes [provider]  oxybutynin (DITROPAN) 5 MG tablet TAKE 1 TABLET (5 MG TOTAL) BY MOUTH 2 (TWO) TIMES DAILY. Patient taking differently: Take 5 mg by mouth every morning.  04/28/17  Yes Sater, Nanine Means, MD  sertraline (ZOLOFT) 50 MG tablet Take 50 mg by mouth. 11/15/15 04/08/18 Yes [provider]  traMADol (ULTRAM) 50 MG tablet Take 1 tablet (50 mg total) by mouth 3 (three) times daily as needed. 01/01/18  Yes Sater, Nanine Means, MD    TRIMETHOPRIM PO Take 100 mg by mouth daily.   Yes [provider]  baclofen (LIORESAL) 10 MG tablet Take 1 tablet (10 mg total) by mouth 3 (three) times daily. Patient not taking: Reported on 07/17/2017 12/11/16   Sater, Nanine Means, MD  meloxicam (MOBIC) 7.5 MG tablet Take 7.5 mg by mouth 2 (two) times daily.    [provider]  nitrofurantoin, macrocrystal-monohydrate, (MACROBID) 100 MG capsule TAKE 1 CAPSULE (100 MG TOTAL) BY MOUTH 2 (TWO) TIMES DAILY. 05/20/16   Sater, Nanine Means, MD    Family History Family History  Problem Relation Age of Onset  . Kidney cancer Mother     Social History Social History   Tobacco Use  . Smoking status: Current Every Day Smoker    Packs/day: 0.50    Types: Cigarettes  . Smokeless tobacco: Never Used  Substance Use Topics  . Alcohol use: No    Alcohol/week: 0.0 oz  . Drug use: No     Allergies   Penicillins; Amoxicillin; Iodinated diagnostic agents; Latex; Shellfish allergy; Shellfish-derived products; Penicillin g; Celecoxib; and Iodine-131   Review of Systems Review of Systems All other systems negative except as documented in the HPI. All pertinent positives and negatives as reviewed in the HPI.  Physical Exam Updated Vital Signs BP 96/60 (BP Location: Right Arm)   Pulse 80   Temp 98.9 F (37.2 C) (Oral)   Resp 18   Ht 5\' 2"  (1.575 m)   Wt 72.6 kg (160 lb)   LMP 10/24/2014   SpO2 95%   BMI 29.26 kg/m   Physical Exam  Constitutional: She is oriented to person, place, and time. She appears well-developed and well-nourished. No distress.  HENT:  Head: Normocephalic and atraumatic.  Mouth/Throat: Oropharynx is clear and moist.  Eyes: Pupils are equal, round, and reactive to light.  Neck: Normal range of motion. Neck supple.  Cardiovascular: Normal rate, regular rhythm and normal heart sounds. Exam reveals no gallop and no friction rub.  No murmur heard. Pulmonary/Chest: Effort normal and breath sounds normal.  No respiratory distress. She has no wheezes.  Abdominal: Soft. Bowel sounds are normal. She exhibits no distension. There is no tenderness.  Musculoskeletal: She exhibits edema.  Neurological: She is alert and oriented to person, place, and time. She exhibits normal muscle tone. Coordination normal.  Skin: Skin is warm and dry. Capillary refill takes less than 2 seconds. No rash noted. No erythema.  Psychiatric: She has a normal mood and affect. Her behavior is normal.  Nursing note and vitals reviewed.    ED Treatments / Results  Labs (all labs ordered are listed, but only abnormal results are displayed) Labs Reviewed  COMPREHENSIVE METABOLIC PANEL - Abnormal; Notable for the following components:      Result  Value   Glucose, Bld 104 (*)    Total Bilirubin 0.2 (*)    All other components within normal limits  BRAIN NATRIURETIC PEPTIDE  CBC WITH DIFFERENTIAL/PLATELET  URINALYSIS, ROUTINE W REFLEX MICROSCOPIC    EKG None  Radiology Dg Chest 2 View  Result Date: 04/08/2018 CLINICAL DATA:  52 y/o F; neck swelling for over 48 hours, difficulty walking, smoker. EXAM: CHEST - 2 VIEW COMPARISON:  02/21/2018 CT chest.  02/21/2018 chest radiograph. FINDINGS: Stable heart size and mediastinal contours are within normal limits. Both lungs are clear. The visualized skeletal structures are unremarkable. Right upper quadrant cholecystectomy clips. IMPRESSION: No acute pulmonary process identified. Electronically Signed   By: Kristine Garbe M.D.   On: 04/08/2018 02:34    Procedures Procedures (including critical care time)  Medications Ordered in ED Medications  HYDROmorphone (DILAUDID) injection 1 mg (1 mg Intravenous Given 04/08/18 0038)  HYDROmorphone (DILAUDID) injection 1 mg (1 mg Intravenous Given 04/08/18 0335)     Initial Impression / Assessment and Plan / ED Course  I have reviewed the triage vital signs and the nursing notes.  Pertinent labs & imaging results that were  available during my care of the patient were reviewed by me and considered in my medical decision making (see chart for details).     Return here as needed.Will have patient follow up with her doctor.  Patient is advised to return here as needed.  We will give the patient symptomatic relief along with Lasix for short course.  Patient's been stable here in the emergency department.  Patient's laboratory testing does not yield any significant abnormality at this time.  Final Clinical Impressions(s) / ED Diagnoses   Final diagnoses:  None    ED Discharge Orders    None       Dalia Heading, PA-C 04/08/18 [id number]    Veryl Speak, MD 04/08/18 [phone]

## 2018-04-08 NOTE — Discharge Instructions (Signed)
Follow-up with your doctor.  Your testing here today did not show any significant abnormality.

## 2018-04-28 ENCOUNTER — Telehealth: Payer: Self-pay | Admitting: Neurology

## 2018-04-28 NOTE — Telephone Encounter (Signed)
Patient states has had pain in her back for a week and needs to see Dr. Felecia Shelling soon.

## 2018-04-29 NOTE — Telephone Encounter (Signed)
Spoke with Bridget [name]. She c/o continued edema in feet, back and leg pain.  At last ov, RAS referred her back to pcp for edema and advised she f/u with spine surgeon that she had seen in the past for back pain.  She sts. she didn't remember this--she will f/u with spine surgeon and call our office back if she needs anything further from us/fim

## 2018-05-26 ENCOUNTER — Other Ambulatory Visit: Payer: Self-pay | Admitting: Neurology

## 2018-05-26 NOTE — Telephone Encounter (Signed)
Faxed printed/signed rx diazepam to [phone]. Received fax confirmation.

## 2020-04-25 ENCOUNTER — Other Ambulatory Visit: Payer: Self-pay

## 2020-04-25 DIAGNOSIS — R609 Edema, unspecified: Secondary | ICD-10-CM

## 2020-05-04 ENCOUNTER — Encounter: Payer: Medicare Other | Admitting: Vascular Surgery

## 2020-05-04 ENCOUNTER — Encounter (HOSPITAL_COMMUNITY): Payer: Medicare Other

## 2020-05-16 ENCOUNTER — Ambulatory Visit (INDEPENDENT_AMBULATORY_CARE_PROVIDER_SITE_OTHER): Payer: Medicare Other | Admitting: Neurology

## 2020-05-16 ENCOUNTER — Telehealth: Payer: Self-pay | Admitting: Neurology

## 2020-05-16 ENCOUNTER — Encounter: Payer: Self-pay | Admitting: Neurology

## 2020-05-16 VITALS — BP 111/76 | HR 72 | Ht 62.0 in

## 2020-05-16 DIAGNOSIS — G373 Acute transverse myelitis in demyelinating disease of central nervous system: Secondary | ICD-10-CM

## 2020-05-16 DIAGNOSIS — G801 Spastic diplegic cerebral palsy: Secondary | ICD-10-CM | POA: Diagnosis not present

## 2020-05-16 DIAGNOSIS — M4802 Spinal stenosis, cervical region: Secondary | ICD-10-CM

## 2020-05-16 DIAGNOSIS — R208 Other disturbances of skin sensation: Secondary | ICD-10-CM | POA: Diagnosis not present

## 2020-05-16 DIAGNOSIS — N319 Neuromuscular dysfunction of bladder, unspecified: Secondary | ICD-10-CM | POA: Diagnosis not present

## 2020-05-16 MED ORDER — BACLOFEN 10 MG PO TABS: 10.0000 mg | ORAL_TABLET | Freq: Three times a day (TID) | ORAL | 0 refills | Status: DC

## 2020-05-16 MED ORDER — OXCARBAZEPINE 300 MG PO TABS: ORAL_TABLET | ORAL | 5 refills | Status: DC

## 2020-05-16 NOTE — Progress Notes (Signed)
[facility]  PATIENT: Bridget [name] DOB: [birth year]  REFERRING DOCTOR OR PCP:  Dr. Tomi Likens [facility]).   PCP is Heide Scales SOURCE: Patient, notes from Dr. Tomi Likens, MRI reports and images on PACS, lab reports  _________________________________   HISTORICAL  CHIEF COMPLAINT:  Chief Complaint  Patient presents with  . New Patient (Initial Visit)    RM 12 with family. Paper referral from Sarah Martinique, MD (PCP) for hx of transverse myelitis, episodes of BUE/BLE pain followed by shaking  . Gait Problem    In WC in office today.    HISTORY OF PRESENT ILLNESS:  Bridget [name]) Is a 54 year old woman who has had 2 episodes of transverse myelitis in 2016 and 2017.  Update 05/16/2020: She had 2 separate episodes of transverse myelitis in the thoracic spine in 2016 and 2017.  She is not able to walk and has an IT trainer wheelchair.  Of note, gait has worsened and she was able to use a walker 2 years ago.  She continues to have a neurogenic bladder.    Pain is increased as well.  Currently, her worse pain is in her back and neck.    Pain is burning and pressure like.   In the past, Lyrica and lamotrigine did not help.   Gabapentin caused cognitive changes and she stopped   She states her Occupational therapist (working on shoulder) has told her that her thoracic spine has more outgrowth than it should.   She sees Dr. Vira Blanco and had a trial of a spinal cord stimulator but had no benefit during the trial (one week).       The MRI of the thoracic spine from 02/23/2015 shows the transverse myelitis at T2-T4.  I reviewed MRI of the thoracic spine from 2020 which was performed since her last visit with me.  The spinal cord appears normal now.  She does have some degenerative changes but there is no spinal cord compression.   From 02/18/2017: TM:   She feels that her walking is worsening. She had transverse myelitis twice, initially in 2016 and again in 2017 at 2 different  levels in the thoracic spine.   MRI of the brain shows a few foci that are nonspecific and have been stable. Initial neuromyelitis optica antibody is negative.  She is experiencing more pain recently..  I reviewed images from her MRI of the brain dated 05/14/2016 showed a few small subcortical and periventricular white matter foci but no change when compared to the previous MRI from 2017. The foci are nonspecific.  LBP/spasms/jerking:    She is having more lower back pain and spasms/jerks.    She is doing some PT but has a lot of difficulty getting comfortable.   She was advised not to sit in a recliner and to try to exercise more   They are going to consider a longer brace (to include her knee)  She gets pain in her lower back, worse if she sits a while or lays flat.  She gets jerking in her legs and often falls.   The jerking is throughout the day but worse if she is drowsy.   A couple weeks ago she went to [facility]. An x-ray of the back just showed minimal degenerative changes at L3-L4 and L4-L5. She has a known disc protrusion at L4-L5 on a 2016 MRI but no definite nerve root compression.  Gait/strength:   There is weakness and spasticity in her legs, left worse  than right. She has a footdrop on the left.         She feels her balance is poor and she does not feel steady if she is not holding something when she stands up.      Arms are fine.  Dysesthesia:   She has dysesthetic pain with allodynia in the left flank and upper leg region.  She has more allodynia without resting dysesthesia in the right leg.    Lyrica and lanmotrigine did not help.   Gabapentin caused cognitive changes and she stopped         Vision:    She notes that there is some visual blurring and reading is difficult. She does not wear glasses.   Bladder:    She has reports urinary frequency, urgency and frequent incontinence. She has moderate constipation.   History of transverse myelitis/possible MS:   In 2013, while  in [place], she fell and had numbness below the waist and had severe leg weakness.  She did not have problems with her bladder.    She went to [facility]) in [place] and [place] airlifted to [facility] and was hospitalized x 7 days.  She had MRIs and an LP in [place] but does not know results.   She had PT and improved by discharge and did additional Rehab at home and at [facility].    She improved almost to baseline, walking independently but having problems going downhill or downstairs.    In 2016, she had the gradual worsening in gait and had a more sudden change in July with much more difficulty with gait and numbness.    She saw Dr, Arnoldo Morale at [facility].     She had MRI's of the spine performed and saw Neurosurgery.   She was referred to Dr. Tomi Likens who saw her.   She improved a little bit so she did not need to use the walker as much.    She also had urinary incontinence.     In March 2017,  she had worsening of gait, numbness and bladder function.  A repeat MRI of the thoracic spine showed another enhancing focus, distinct from the first one.   She underwent a lumbar puncture and the CSF was reportedly normal.    Anti-NMO Ab was borderline.    I saw her a few weeks later.   Repeat NMO-Ab was negative.      I have reviewed multiple MRIs. The MRI of the brain from 04/13/2015 and 12/30/2014 just showed 2-3 small T2/FLAIR hyperintense foci that are very nonspecific. MRI of the thoracic spine 02/23/2015 shows a lesion with edema at T2-T4.    MRI 09/20/2015 showed interval resolution of that focus. MRI of the thoracic spine 12/30/2015 shows a different enhancing lesion adjacent to T5-T6 associated with edema.  MRI of the cervical spine shows a normal spinal cord but she has significant foraminal narrowing at C5-C6 and C6-C7 due to degenerative changes.    I have also reviewed some laboratory tests. The NMO antibody was borderline at 1.6 initially but then was <  1.5 at second check.      She had a lumbar puncture for 01/09/2016. Although the actual results are not in the EMR there is a reference to the spinal fluid being normal on a telephone note.   An additional MRI of the brain in August 2017 showed no new lesions.  MRI of the thoracic spine in 2020 did not show any new  lesions.Marland Kitchen    REVIEW OF SYSTEMS: Constitutional: No fevers, chills, sweats, or change in appetite Eyes: No visual changes, double vision, eye pain Ear, nose and throat: No hearing loss, ear pain, nasal congestion, sore throat Cardiovascular: No chest pain, palpitations Respiratory: No shortness of breath at rest or with exertion.   No wheezes GastrointestinaI: No nausea, vomiting, diarrhea, abdominal pain, fecal incontinence Genitourinary: No dysuria, urinary retention or frequency.  No nocturia. Musculoskeletal: No neck pain, back pain Integumentary: No rash, pruritus, skin lesions Neurological: as above Psychiatric: No depression at this time.  No anxiety Endocrine: No palpitations, diaphoresis, change in appetite, change in weigh or increased thirst Hematologic/Lymphatic: No anemia, purpura, petechiae. Allergic/Immunologic: No itchy/runny eyes, nasal congestion, recent allergic reactions, rashes  ALLERGIES: Allergies  Allergen Reactions  . Penicillins Anaphylaxis, Rash and Swelling    Throat swelling Swelling/throat closes   . Amoxicillin Swelling and Other (See Comments)    Unknown Unknown Eyes swelling  . Iodinated Diagnostic Agents Nausea And Vomiting  . Latex Other (See Comments) and Rash    Rash and blisters from powder in glove Rash and blisters from powder in glove Other reaction(s): Other (See Comments) Rash from powder in glove  . Shellfish Allergy Itching  . Shellfish-Derived Products Itching  . Penicillin G Other (See Comments)    Unknown  . Celecoxib Itching  . Iodine-131 Rash    HOME MEDICATIONS:  Current Outpatient Medications:  .   amitriptyline (ELAVIL) 25 MG tablet, Take 50 mg by mouth at bedtime., Disp: , Rfl:  .  cloNIDine (CATAPRES) 0.1 MG tablet, Take 0.1 mg by mouth at bedtime., Disp: , Rfl:  .  hydrochlorothiazide (MICROZIDE) 12.5 MG capsule, Take 12.5 mg by mouth daily., Disp: , Rfl:  .  linaclotide (LINZESS) 145 MCG CAPS capsule, Take 145 mcg by mouth as needed. , Disp: , Rfl:  .  meloxicam (MOBIC) 7.5 MG tablet, Take 7.5 mg by mouth daily as needed. , Disp: , Rfl:  .  omeprazole (PRILOSEC) 20 MG capsule, Take 20 mg by mouth daily., Disp: , Rfl:  .  potassium chloride (KLOR-CON) 10 MEQ tablet, Take 10 mEq by mouth daily., Disp: , Rfl:  .  sertraline (ZOLOFT) 25 MG tablet, Take 25 mg by mouth daily., Disp: , Rfl:  .  solifenacin (VESICARE) 5 MG tablet, Take 5 mg by mouth daily., Disp: , Rfl:  .  baclofen (LIORESAL) 10 MG tablet, Take 1 tablet (10 mg total) by mouth 3 (three) times daily., Disp: 30 each, Rfl: 0 .  LORazepam (ATIVAN) 1 MG tablet, Take 1 mg by mouth. Take 1 tab po 60min prior to procedure. May make you sleepy. May repeat dose in 30 min prn (Patient not taking: Reported on 05/16/2020), Disp: , Rfl:  .  Oxcarbazepine (TRILEPTAL) 300 MG tablet, Ome po qAM and two po qHS, Disp: 90 tablet, Rfl: 5 .  promethazine (PHENERGAN) 25 MG tablet, Take 25 mg by mouth every 6 (six) hours as needed for nausea or vomiting. (Patient not taking: Reported on 05/16/2020), Disp: , Rfl:   PAST MEDICAL HISTORY: Past Medical History:  Diagnosis Date  . Back pain   . Neuropathy   . Spinal stenosis     PAST SURGICAL HISTORY: Past Surgical History:  Procedure Laterality Date  . BREAST LUMPECTOMY Right    benign  . CHOLECYSTECTOMY    . TUBAL LIGATION      FAMILY HISTORY: Family History  Problem Relation Age of Onset  . Kidney cancer Mother  SOCIAL HISTORY:  Social History   Socioeconomic History  . Marital status: Divorced    Spouse name: Not on file  . Number of children: Not on file  . Years of  education: Not on file  . Highest education level: Not on file  Occupational History  . Not on file  Tobacco Use  . Smoking status: Current Every Day Smoker    Packs/day: 0.50    Types: Cigarettes  . Smokeless tobacco: Never Used  . Tobacco comment: 1/2 pack or less per day  Substance and Sexual Activity  . Alcohol use: No    Alcohol/week: 0.0 standard drinks  . Drug use: No  . Sexual activity: Not on file  Other Topics Concern  . Not on file  Social History Narrative   ** Merged History Encounter **   Lives with boyfriend, Adriana Mccallum.  Education 12th grade diploma.  Unemployed.  2 children.     Caffeine use: Dr pepper sometimes   Tea daily    Left handed    Social Determinants of Health   Financial Resource Strain:   . Difficulty of Paying Living Expenses:   Food Insecurity:   . Worried About Charity fundraiser in the Last Year:   . Arboriculturist in the Last Year:   Transportation Needs:   . Film/video editor (Medical):   Marland Kitchen Lack of Transportation (Non-Medical):   Physical Activity:   . Days of Exercise per Week:   . Minutes of Exercise per Session:   Stress:   . Feeling of Stress :   Social Connections:   . Frequency of Communication with Friends and Family:   . Frequency of Social Gatherings with Friends and Family:   . Attends Religious Services:   . Active Member of Clubs or Organizations:   . Attends Archivist Meetings:   Marland Kitchen Marital Status:   Intimate Partner Violence:   . Fear of Current or Ex-Partner:   . Emotionally Abused:   Marland Kitchen Physically Abused:   . Sexually Abused:      PHYSICAL EXAM  Vitals:   05/16/20 1030  BP: 111/76  Pulse: 72  SpO2: 98%  Height: 5\' 2"  (1.575 m)    Body mass index is 29.26 kg/m.   General: The patient is well-developed and well-nourished and in no acute distress she has.   She has moderate pitting edema at the ankles.  The right foot is wrapped.  Musculoskeletal:  Back is  nontender  Neurologic Exam  Mental status: The patient is alert and oriented x 3 at the time of the examination. The patient has apparent normal recent and remote memory, with an apparently normal attention span and concentration ability.   Speech is normal.  Cranial nerves: Extraocular movements are full. Facial sense and strength is normal.  Trapezius and sternocleidomastoid strength is normal. No dysarthria is noted.  The tongue is midline, and the patient has symmetric elevation of the soft palate. No obvious hearing deficits are noted.  Motor:  Muscle bulk is normal.  Arm strength is normal.  Tone is increased in bilateral legs.   Strength is  4-/ 5 in in the proximal left leg and 4 - to 4/5 distally.  Strength is 3 to 4-/5 in the right leg.  Sensory: Sensory testing is intact to pinprick, soft touch and vibration sensation in arms.  She has a mid thoracic sensory level with a reduced touch on the right and temperature and touch in  the legs, worse on the left.  There is some allodynia in the feet  Coordination: Cerebellar testing reveals good finger-nose-finger but she is unable to do heel-to-shin bilaterally.  Gait and station: Station is normal.   She is unable to stand with bilateral support    Romberg is positive.   Reflexes: Deep tendon reflexes are normal in the arms.  DTRs are increased in the legs with spread at the knees.  She has sustained left and nonsustained right ankle clonus..      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  Due to worsening symptoms, we need to check MRI of the cervical and thoracic spine to determine if she has had new transverse myelitis or if the cervical  stenosis or other degenerative change has worsened enough to affect her leg strength. 2.  Re check NMO antibody and also check anti-MOG.  I will should check CMP to make sure sodium is not low. 3.   Oxcarbazepine 300 mg in the morning 6 mg at night for her dysesthetic pain.  Baclofen for spasticity. 4.   She will return to see me in 4 months or sooner if there are new or worsening neurologic symptoms.  45-minute office visit with the majority of the time spent face-to-face for history and physical, discussion/counseling and decision-making.  Additional time with record review and documentation.  Ariv Penrod A. Felecia Shelling, MD, PhD 12/26/2246, 25:00 PM Certified in Neurology, Clinical Neurophysiology, Sleep Medicine, Pain Medicine and Neuroimaging  [facility] [address], [place],  [address] [phone]

## 2020-05-16 NOTE — Telephone Encounter (Signed)
Medicare/medicaid order sent to GI. No auth they will reach out to the patient to schedule.  °

## 2020-05-17 LAB — CBC WITH DIFFERENTIAL/PLATELET
Basophils Absolute: 0.1 10*3/uL (ref 0.0–0.2)
Basos: 1 %
EOS (ABSOLUTE): 0.1 10*3/uL (ref 0.0–0.4)
Eos: 2 %
Hematocrit: 43.3 % (ref 34.0–46.6)
Hemoglobin: 14.3 g/dL (ref 11.1–15.9)
Immature Grans (Abs): 0 10*3/uL (ref 0.0–0.1)
Immature Granulocytes: 0 %
Lymphocytes Absolute: 2.2 10*3/uL (ref 0.7–3.1)
Lymphs: 39 %
MCH: 31 pg (ref 26.6–33.0)
MCHC: 33 g/dL (ref 31.5–35.7)
MCV: 94 fL (ref 79–97)
Monocytes Absolute: 0.3 10*3/uL (ref 0.1–0.9)
Monocytes: 5 %
Neutrophils Absolute: 3.1 10*3/uL (ref 1.4–7.0)
Neutrophils: 53 %
Platelets: 239 10*3/uL (ref 150–450)
RBC: 4.62 x10E6/uL (ref 3.77–5.28)
RDW: 13.2 % (ref 11.7–15.4)
WBC: 5.7 10*3/uL (ref 3.4–10.8)

## 2020-05-17 LAB — COMPREHENSIVE METABOLIC PANEL
ALT: 12 IU/L (ref 0–32)
AST: 14 IU/L (ref 0–40)
Albumin/Globulin Ratio: 2.6 — ABNORMAL HIGH (ref 1.2–2.2)
Albumin: 4.7 g/dL (ref 3.8–4.9)
Alkaline Phosphatase: 72 IU/L (ref 48–121)
BUN/Creatinine Ratio: 13 (ref 9–23)
BUN: 12 mg/dL (ref 6–24)
Bilirubin Total: 0.4 mg/dL (ref 0.0–1.2)
CO2: 24 mmol/L (ref 20–29)
Calcium: 9.8 mg/dL (ref 8.7–10.2)
Chloride: 104 mmol/L (ref 96–106)
Creatinine, Ser: 0.89 mg/dL (ref 0.57–1.00)
GFR calc Af Amer: 86 mL/min/{1.73_m2} (ref >59)
GFR calc non Af Amer: 75 mL/min/{1.73_m2} (ref >59)
Globulin, Total: 1.8 g/dL (ref 1.5–4.5)
Glucose: 93 mg/dL (ref 65–99)
Potassium: 5 mmol/L (ref 3.5–5.2)
Sodium: 141 mmol/L (ref 134–144)
Total Protein: 6.5 g/dL (ref 6.0–8.5)

## 2020-05-17 LAB — ANTI-MOG, SERUM: MOG Antibody, Cell-based IFA: NEGATIVE

## 2020-05-17 LAB — NEUROMYELITIS OPTICA AUTOAB, IGG: NMO IgG Autoantibodies: <1.5 U/mL (ref 0.0–3.0)

## 2020-06-09 ENCOUNTER — Ambulatory Visit
Admission: RE | Admit: 2020-06-09 | Discharge: 2020-06-09 | Disposition: A | Discharge status: Home / Self Care | Payer: Medicare Other | Source: Ambulatory Visit | Attending: Neurology | Admitting: Neurology

## 2020-06-09 ENCOUNTER — Other Ambulatory Visit: Payer: Self-pay

## 2020-06-09 DIAGNOSIS — G373 Acute transverse myelitis in demyelinating disease of central nervous system: Secondary | ICD-10-CM

## 2020-06-09 DIAGNOSIS — G801 Spastic diplegic cerebral palsy: Secondary | ICD-10-CM

## 2020-06-09 DIAGNOSIS — R208 Other disturbances of skin sensation: Secondary | ICD-10-CM

## 2020-06-16 ENCOUNTER — Other Ambulatory Visit: Payer: Self-pay

## 2020-06-16 DIAGNOSIS — R609 Edema, unspecified: Secondary | ICD-10-CM

## 2020-06-25 ENCOUNTER — Inpatient Hospital Stay: Admission: RE | Admit: 2020-06-25 | Payer: Medicare Other | Source: Ambulatory Visit

## 2020-06-25 ENCOUNTER — Other Ambulatory Visit: Payer: Medicare Other

## 2020-06-29 ENCOUNTER — Other Ambulatory Visit: Payer: Self-pay

## 2020-06-29 ENCOUNTER — Ambulatory Visit (HOSPITAL_COMMUNITY)
Admission: RE | Admit: 2020-06-29 | Discharge: 2020-06-29 | Disposition: A | Discharge status: Home / Self Care | Payer: Medicare Other | Source: Ambulatory Visit | Attending: Vascular Surgery | Admitting: Vascular Surgery

## 2020-06-29 ENCOUNTER — Ambulatory Visit (INDEPENDENT_AMBULATORY_CARE_PROVIDER_SITE_OTHER): Payer: Medicare Other | Admitting: Vascular Surgery

## 2020-06-29 ENCOUNTER — Encounter: Payer: Self-pay | Admitting: Vascular Surgery

## 2020-06-29 VITALS — BP 103/73 | HR 76 | Temp 97.9°F | Resp 20 | Ht 62.0 in | Wt 160.0 lb

## 2020-06-29 DIAGNOSIS — R609 Edema, unspecified: Secondary | ICD-10-CM

## 2020-06-29 DIAGNOSIS — M7989 Other specified soft tissue disorders: Secondary | ICD-10-CM | POA: Diagnosis not present

## 2020-06-29 NOTE — Progress Notes (Signed)
Referring Physician: Sarah Martinique MD  Patient name: Bridget [name] MRN: [id number] DOB: 1966/08/20 Sex: female  REASON FOR CONSULT: Bilateral leg swelling  HPI: Bridget [name] is a 54 y.o. female, referred for bilateral lower extremity edema.  Patient was previously seen for similar symptoms in 2018.  That time she had significant neurologic deficit and was being evaluated for transverse myelitis.  She has had continued neurologic decline since then and is essentially in a wheelchair full-time at this point.  Her venous duplex exam in 2018 showed no significant venous reflux no evidence of DVT.  She states that she thinks her leg swelling is worse.  She does not have any nonhealing wounds.  Other medical problems include spinal stenosis neuropathy chronic back pain all of which are chronically still under evaluation.  The patient has not really worn compression stockings much lately.  Past Medical History:  Diagnosis Date  . Back pain   . Neuropathy   . Spinal stenosis    Past Surgical History:  Procedure Laterality Date  . BREAST LUMPECTOMY Right    benign  . CHOLECYSTECTOMY    . TUBAL LIGATION      Family History  Problem Relation Age of Onset  . Kidney cancer Mother     SOCIAL HISTORY: Social History   Socioeconomic History  . Marital status: Divorced    Spouse name: Not on file  . Number of children: Not on file  . Years of education: Not on file  . Highest education level: Not on file  Occupational History  . Not on file  Tobacco Use  . Smoking status: Current Every Day Smoker    Packs/day: 0.25    Types: Cigarettes  . Smokeless tobacco: Never Used  . Tobacco comment: 1/2 pack or less per day  Vaping Use  . Vaping Use: Never used  Substance and Sexual Activity  . Alcohol use: No    Alcohol/week: 0.0 standard drinks  . Drug use: No  . Sexual activity: Not on file  Other Topics Concern  . Not on file  Social History Narrative   ** Merged History Encounter  **   Lives with boyfriend, Adriana Mccallum.  Education 12th grade diploma.  Unemployed.  2 children.     Caffeine use: Dr pepper sometimes   Tea daily    Left handed    Social Determinants of Health   Financial Resource Strain:   . Difficulty of Paying Living Expenses: Not on file  Food Insecurity:   . Worried About Charity fundraiser in the Last Year: Not on file  . Ran Out of Food in the Last Year: Not on file  Transportation Needs:   . Lack of Transportation (Medical): Not on file  . Lack of Transportation (Non-Medical): Not on file  Physical Activity:   . Days of Exercise per Week: Not on file  . Minutes of Exercise per Session: Not on file  Stress:   . Feeling of Stress : Not on file  Social Connections:   . Frequency of Communication with Friends and Family: Not on file  . Frequency of Social Gatherings with Friends and Family: Not on file  . Attends Religious Services: Not on file  . Active Member of Clubs or Organizations: Not on file  . Attends Archivist Meetings: Not on file  . Marital Status: Not on file  Intimate Partner Violence:   . Fear of Current or Ex-Partner: Not on file  .  Emotionally Abused: Not on file  . Physically Abused: Not on file  . Sexually Abused: Not on file    Allergies  Allergen Reactions  . Penicillins Anaphylaxis, Rash and Swelling    Throat swelling Swelling/throat closes   . Amoxicillin Swelling and Other (See Comments)    Unknown Unknown Eyes swelling  . Iodinated Diagnostic Agents Nausea And Vomiting  . Latex Other (See Comments) and Rash    Rash and blisters from powder in glove Rash and blisters from powder in glove Other reaction(s): Other (See Comments) Rash from powder in glove  . Shellfish Allergy Itching  . Shellfish-Derived Products Itching  . Penicillin G Other (See Comments)    Unknown  . Celecoxib Itching  . Iodine-131 Rash    Current Outpatient Medications  Medication Sig Dispense Refill  .  albuterol (PROAIR HFA) 108 (90 Base) MCG/ACT inhaler     . amitriptyline (ELAVIL) 25 MG tablet Take 50 mg by mouth at bedtime.    . baclofen (LIORESAL) 10 MG tablet Take 1 tablet (10 mg total) by mouth 3 (three) times daily. 30 each 0  . budesonide-formoterol (SYMBICORT) 160-4.5 MCG/ACT inhaler Inhale into the lungs.    . cloNIDine (CATAPRES) 0.1 MG tablet Take 0.1 mg by mouth at bedtime.    . cyclobenzaprine (FLEXERIL) 5 MG tablet     . dicyclomine (BENTYL) 20 MG tablet Take 20 mg by mouth 3 (three) times daily.    . hydrochlorothiazide (MICROZIDE) 12.5 MG capsule Take 12.5 mg by mouth daily.    . hydrOXYzine (VISTARIL) 25 MG capsule     . lamoTRIgine (LAMICTAL) 100 MG tablet Take by mouth.    . linaclotide (LINZESS) 145 MCG CAPS capsule Take 145 mcg by mouth as needed.     . meloxicam (MOBIC) 7.5 MG tablet Take 7.5 mg by mouth daily as needed.     . montelukast (SINGULAIR) 10 MG tablet Take 1 tablet by mouth daily.    . naproxen (NAPROSYN) 500 MG tablet Take 500 mg by mouth 2 (two) times daily.    Marland Kitchen omeprazole (PRILOSEC) 20 MG capsule Take 20 mg by mouth daily.    . Oxcarbazepine (TRILEPTAL) 300 MG tablet Ome po qAM and two po qHS 90 tablet 5  . oxybutynin (DITROPAN) 5 MG tablet Take by mouth.    . pantoprazole (PROTONIX) 40 MG tablet     . potassium chloride (KLOR-CON) 10 MEQ tablet Take 10 mEq by mouth daily.    . potassium chloride (KLOR-CON) 10 MEQ tablet Take by mouth.    . sertraline (ZOLOFT) 25 MG tablet Take 25 mg by mouth daily.    . solifenacin (VESICARE) 5 MG tablet Take 5 mg by mouth daily.    . tamsulosin (FLOMAX) 0.4 MG CAPS capsule Take 0.4 mg by mouth 2 (two) times daily.    Marland Kitchen LORazepam (ATIVAN) 1 MG tablet Take 1 mg by mouth. Take 1 tab po 35min prior to procedure. May make you sleepy. May repeat dose in 30 min prn (Patient not taking: Reported on 05/16/2020)    . promethazine (PHENERGAN) 25 MG tablet Take 25 mg by mouth every 6 (six) hours as needed for nausea or vomiting.  (Patient not taking: Reported on 05/16/2020)     No current facility-administered medications for this visit.    ROS:   General:  No weight loss, Fever, chills  HEENT: No recent headaches, no nasal bleeding, no visual changes, no sore throat  Neurologic: No dizziness, blackouts, seizures. No recent  symptoms of stroke or mini- stroke. No recent episodes of slurred speech, or temporary blindness.  Cardiac: No recent episodes of chest pain/pressure, no shortness of breath at rest.  No shortness of breath with exertion.  Denies history of atrial fibrillation or irregular heartbeat  Vascular: No history of rest pain in feet.  No history of claudication.  No history of non-healing ulcer, No history of DVT   Pulmonary: No home oxygen, no productive cough, no hemoptysis,  No asthma or wheezing  Musculoskeletal:  [ ]  Arthritis, [X]  Low back pain,  [ ]  Joint pain  Hematologic:No history of hypercoagulable state.  No history of easy bleeding.  No history of anemia  Gastrointestinal: No hematochezia or melena,  No gastroesophageal reflux, no trouble swallowing  Urinary: [ ]  chronic Kidney disease, [ ]  on HD - [ ]  MWF or [ ]  TTHS, [ ]  Burning with urination, [ ]  Frequent urination, [ ]  Difficulty urinating;   Skin: No rashes  Psychological: No history of anxiety,  No history of depression   Physical Examination  Vitals:   06/29/20 1354  BP: 103/73  Pulse: 76  Resp: 20  Temp: 97.9 F (36.6 C)  SpO2: 94%  Weight: 160 lb (72.6 kg)  Height: 5\' 2"  (1.575 m)    Body mass index is 29.26 kg/m.  General:  Alert and oriented, no acute distress HEENT: Normal Neck: No JVD Cardiac: Regular Rate and Rhythm Skin: No rash Extremity Pulses:  2+dorsalis pedis pulses bilaterally Musculoskeletal: No deformity 1+ bilateral pretibial and pedal edema   DATA:  Patient had a venous duplex today for reflux.  This is essentially unchanged from 2018.  She has no evidence of superficial or deep venous  reflux.  There is no evidence of DVT.  ASSESSMENT: Patient with chronic lower extremity edema most likely secondary to autonomic dysfunction from her neurologic issues as well as leg dependent edema from sitting in a wheelchair all day long.  She has no evidence of superficial or deep venous reflux or DVT similar to 2018.  She was counseled today on elevating her legs and trying to get out of the wheelchair some if possible with her legs elevated to decrease edema.  She was also given a prescription today for lower extremity compression stockings.   PLAN: Lower extremity knee-high compression stockings 2030 mmHg to control swelling elevate legs when possible.  Patient will follow up on an as-needed basis.   Ruta Hinds, MD [facility] Office: [phone]

## 2020-07-11 ENCOUNTER — Ambulatory Visit
Admission: RE | Admit: 2020-07-11 | Discharge: 2020-07-11 | Disposition: A | Discharge status: Home / Self Care | Payer: Medicare Other | Source: Ambulatory Visit | Attending: Neurology | Admitting: Neurology

## 2020-07-11 ENCOUNTER — Other Ambulatory Visit: Payer: Self-pay

## 2020-07-11 DIAGNOSIS — G373 Acute transverse myelitis in demyelinating disease of central nervous system: Secondary | ICD-10-CM | POA: Diagnosis not present

## 2020-07-11 DIAGNOSIS — G801 Spastic diplegic cerebral palsy: Secondary | ICD-10-CM | POA: Diagnosis not present

## 2020-07-11 DIAGNOSIS — R208 Other disturbances of skin sensation: Secondary | ICD-10-CM | POA: Diagnosis not present

## 2020-08-28 ENCOUNTER — Telehealth: Payer: Self-pay | Admitting: Neurology

## 2020-08-28 DIAGNOSIS — M545 Low back pain, unspecified: Secondary | ICD-10-CM

## 2020-08-28 DIAGNOSIS — R208 Other disturbances of skin sensation: Secondary | ICD-10-CM

## 2020-08-28 MED ORDER — NAPROXEN 500 MG PO TABS: 500.0000 mg | ORAL_TABLET | Freq: Two times a day (BID) | ORAL | 1 refills | Status: DC | PRN

## 2020-08-28 NOTE — Addendum Note (Signed)
Addended by: Wyvonnia Lora on: 08/28/2020 04:26 PM   Modules accepted: Orders

## 2020-08-28 NOTE — Telephone Encounter (Signed)
Spoke with Dr. Felecia Shelling. She already took 300mg  oxcarbazepine this am and 600mg  this afternoon. She can take an additional 600mg   at bedtime. She can repeat this tomorrow. Then she should go back to 300mg  in the am and 600mg  po qhs thereafter. He reviewed imaging completed and does not see reason for sx. Feels it is more related to muscle spasms.  We can also refer her to physical therapy to try and help sx.

## 2020-08-28 NOTE — Telephone Encounter (Signed)
Pt. states she can hardly bend over or stand up. She states she is in massive pain. She asks what she can do for pain, she's tried several different methods. Please advise.

## 2020-08-28 NOTE — Telephone Encounter (Signed)
Review okay for her to take an additional dose of the oxcarbazepine today.  She had a prescription for naproxen 500 mg.  She could take that twice a day if she is not already doing so.

## 2020-08-28 NOTE — Telephone Encounter (Signed)
Called and spoke with pt. Relayed Dr. Garth Bigness recommendation. She is agreeable to try this. She has no naproxen left. Unsure who rx'd last. Per Dr. Felecia Shelling, ok to call in naproxen 500mg  #60 #1 bid prn. I e-scribed to [facility]. Pt aware. She will try heating pad intermittently. Unable to get in bath by herself, lives alone. Has tried running hot water on her back, ineffective. I placed referral for PT to [facility] hospital per pt request.

## 2020-08-28 NOTE — Telephone Encounter (Signed)
Called/spoke with pt. She reports she went to [facility] on 08/25/20 d/t severe back pain. Improved after receiving IV muscle relaxer/fluids. Xray/CT completed while she was there. Today, sx started worsening again. Pain located in her lower middle back above her tailbone. Feels like "electricity". She took oxcarbazepine 300mg  this am and just took another 600mg  now (aware her directions were 1 in the morning and 2 at bed). She states she is in so much pain she wanted to see if taking second dose now would help. She was given a muscle relaxer at the hospital to take orally once she got discharged but cannot remember the name. States she is in too much pain to go look. Very hard for her to transfer from scooter to couch. Takes the "breath" out of her. Advised I will send MD message and call back with recommendation.  I reviewed ED note from 08/25/20. These were the meds prescribed to pt: HYDROCODONE-ACETAMINOPHEN (NORCO) 5-325 MG PER TABLET Take 1 tablet by mouth every 6 (six) hours as needed for up to 5 days.  METHOCARBAMOL (ROBAXIN) 750 MG TABLET Take 1 tablet (750 mg total) by mouth 3 times daily for 10 days.  PREDNISONE (DELTASONE) 10 MG TABLET PACK Take by mouth See Admin Instructions for 6 doses. Take as directed on dose pack for 6 days.

## 2020-09-27 ENCOUNTER — Telehealth: Payer: Self-pay | Admitting: Neurology

## 2020-09-27 NOTE — Telephone Encounter (Signed)
I tried calling the number in patient's chart and I'm getting a message that says the number has either been changed or disconnected.   Dr. Felecia Shelling sent patient a MyChart message with MRI results on 07/11/2020 and shows that the patient reviewed that message on 07/20/2020.

## 2020-09-27 NOTE — Telephone Encounter (Signed)
Pt called, I had a MRI done July 11, 2020 and no one has called me with the results. Would like a call from the nurse.

## 2020-09-28 NOTE — Telephone Encounter (Signed)
I tried calling the patient again but I am still getting the same message. I will send patient a MyChart message.

## 2020-10-03 ENCOUNTER — Other Ambulatory Visit: Payer: Self-pay | Admitting: Neurology

## 2020-10-03 DIAGNOSIS — M5412 Radiculopathy, cervical region: Secondary | ICD-10-CM

## 2020-10-03 NOTE — Telephone Encounter (Signed)
Patient called back and I reviewed the MRI results with her. She states that she gave her new phone number to [place] last time but it must not have been recorded. Patient says that her symptoms have gotten progressively worse. She had an episode where her neck got stuck in the downward position, she went to the ED but all they gave her was some pain medications that she did not want to take. She also mentioned that her legs have gotten a lot weaker. She would like that referral to neurosurgery. I updated her phone number in the chart. [phone].

## 2020-10-03 NOTE — Telephone Encounter (Signed)
Called pt back. She is having continued sharp pains in her neck and now having pain in back. Trying heat, ineffective. She never went through PT that she was referred for. Was told by PT that they did not want to worsen her sx. About 2 weeks ago, she could not hold her head up/pull it up, could not sit up or feel legs. EMS called and they transported her to [facility]. They did xray/ct. Offered morphine/muscle relaxer. She refused muscle relaxer, stating she does not want to get addicted.  Wanting to be referred back to [facility]/Dr. Lovell Sheehan for evaluation/treatment. Advised Dr. Epimenio Foot this week, will send request to Dr. Pearlean Brownie who is the covering MD.

## 2020-10-03 NOTE — Telephone Encounter (Signed)
Pt returned call. Pt states it will take her a bit to get to the phone but to please call her back.

## 2020-10-03 NOTE — Telephone Encounter (Signed)
Tried calling pt back, mailbox full and unable to LVM

## 2020-10-03 NOTE — Telephone Encounter (Signed)
Referral made to Dr. Jenkins  

## 2020-10-13 ENCOUNTER — Other Ambulatory Visit: Payer: Self-pay | Admitting: Neurosurgery

## 2020-10-16 ENCOUNTER — Other Ambulatory Visit: Payer: Self-pay | Admitting: Neurosurgery

## 2020-10-27 NOTE — Progress Notes (Signed)
Your procedure is scheduled on Thursday, November 09, 2020 (12:02PM - 3:38PM).  Report to [facility] Main Entrance "A" at 10:00 A.M., and check in at the Admitting office.  Call this number if you have problems the morning of surgery:  [phone]  Call [phone] if you have any questions prior to your surgery date Monday-Friday 8am-4pm    Remember:  Do not eat or drink after midnight the night before your surgery    Take these medicines the morning of surgery with A SIP OF WATER:  baclofen (LIORESAL) budesonide-formoterol (SYMBICORT)  dicyclomine (BENTYL)  montelukast (SINGULAIR)  Oxcarbazepine (TRILEPTAL) sertraline (ZOLOFT)  albuterol (VENTOLIN HFA) - if needed cyclobenzaprine (FLEXERIL) - if needed  Please bring all inhalers with you the day of surgery.    As of today, STOP taking any Aspirin (unless otherwise instructed by your surgeon) Aleve, Naproxen, Ibuprofen, Motrin, Advil, Goody's, BC's, all herbal medications, fish oil, and all vitamins.                      Do not wear jewelry, make up, or nail polish            Do not wear lotions, powders, perfumes, or deodorant.            Do not shave 48 hours prior to surgery.            Do not bring valuables to the hospital.            [facility] is not responsible for any belongings or valuables.  Do NOT Smoke (Tobacco/Vaping) or drink Alcohol 24 hours prior to your procedure If you use a CPAP at night, you may bring all equipment for your overnight stay.   Contacts, glasses, dentures or bridgework may not be worn into surgery.      For patients admitted to the hospital, discharge time will be determined by your treatment team.   Patients discharged the day of surgery will not be allowed to drive home, and someone needs to stay with them for 24 hours.    Special instructions:   [facility]- Preparing For Surgery  Before surgery, you can play an important role. Because skin is not sterile, your skin needs to be  as free of germs as possible. You can reduce the number of germs on your skin by washing with CHG (chlorahexidine gluconate) Soap before surgery.  CHG is an antiseptic cleaner which kills germs and bonds with the skin to continue killing germs even after washing.    Oral Hygiene is also important to reduce your risk of infection.  Remember - BRUSH YOUR TEETH THE MORNING OF SURGERY WITH YOUR REGULAR TOOTHPASTE  Please do not use if you have an allergy to CHG or antibacterial soaps. If your skin becomes reddened/irritated stop using the CHG.  Do not shave (including legs and underarms) for at least 48 hours prior to first CHG shower. It is OK to shave your face.  Please follow these instructions carefully.   1. Shower the NIGHT BEFORE SURGERY and the MORNING OF SURGERY with CHG Soap.   2. If you chose to wash your hair, wash your hair first as usual with your normal shampoo.  3. After you shampoo, rinse your hair and body thoroughly to remove the shampoo.  4. Use CHG as you would any other liquid soap. You can apply CHG directly to the skin and wash gently with a scrungie or a clean washcloth.   5.  Apply the CHG Soap to your body ONLY FROM THE NECK DOWN.  Do not use on open wounds or open sores. Avoid contact with your eyes, ears, mouth and genitals (private parts). Wash Face and genitals (private parts)  with your normal soap.   6. Wash thoroughly, paying special attention to the area where your surgery will be performed.  7. Thoroughly rinse your body with warm water from the neck down.  8. DO NOT shower/wash with your normal soap after using and rinsing off the CHG Soap.  9. Pat yourself dry with a CLEAN TOWEL.  10. Wear CLEAN PAJAMAS to bed the night before surgery  11. Place CLEAN SHEETS on your bed the night of your first shower and DO NOT SLEEP WITH PETS.   Day of Surgery: Wear Clean/Comfortable clothing the morning of surgery Do not apply any deodorants/lotions.   Remember to  brush your teeth WITH YOUR REGULAR TOOTHPASTE.   Please read over the following fact sheets that you were given.

## 2020-10-30 ENCOUNTER — Other Ambulatory Visit (HOSPITAL_COMMUNITY): Payer: Medicare Other

## 2020-10-30 ENCOUNTER — Inpatient Hospital Stay (HOSPITAL_COMMUNITY)
Admission: RE | Admit: 2020-10-30 | Discharge: 2020-10-30 | Disposition: A | Discharge status: Home / Self Care | Payer: Medicare Other | Source: Ambulatory Visit

## 2020-11-09 ENCOUNTER — Encounter: Admission: RE | Payer: Self-pay | Source: Home / Self Care

## 2020-11-09 ENCOUNTER — Inpatient Hospital Stay: Admission: RE | Admit: 2020-11-09 | Payer: Medicare Other | Source: Home / Self Care | Admitting: Neurosurgery

## 2020-11-09 SURGERY — ANTERIOR CERVICAL DECOMPRESSION/DISCECTOMY FUSION 3 LEVELS
Anesthesia: General

## 2020-11-19 ENCOUNTER — Other Ambulatory Visit: Payer: Self-pay | Admitting: Neurology

## 2021-01-09 ENCOUNTER — Other Ambulatory Visit: Payer: Self-pay | Admitting: Neurosurgery

## 2021-01-29 NOTE — Pre-Procedure Instructions (Signed)
Surgical Instructions    Your procedure is scheduled on Thursday April 28th.  Report to [facility] Main Entrance "A" at 0530 A.M., then check in with the Admitting office.  Call this number if you have problems the morning of surgery:  [phone]   If you have any questions prior to your surgery date call [phone]: Open Monday-Friday 8am-4pm    Remember:  Do not eat or drink after midnight the night before your surgery    Take these medicines the morning of surgery with A SIP OF WATER   metoprolol tartrate (LOPRESSOR)  montelukast (SINGULAIR)  Oxcarbazepine (TRILEPTAL)  sertraline (ZOLOFT)  tamsulosin (FLOMAX)    fluticasone furoate-vilanterol (BREO ELLIPTA)    Take these medicines if needed  albuterol (VENTOLIN HFA)- Please bring all inhalers with you on the day of surgery  budesonide-formoterol (SYMBICORT)  cyclobenzaprine (FLEXERIL)  linaclotide (LINZESS)    methocarbamol (ROBAXIN)  promethazine (PHENERGAN)  traMADol (ULTRAM)     As of today, STOP taking any Aspirin (unless otherwise instructed by your surgeon) Aleve, Naproxen, Ibuprofen, Motrin, Advil, Goody's, BC's, all herbal medications, fish oil, and all vitamins.                     Do not wear jewelry, make up, or nail polish            Do not wear lotions, powders, perfumes or deodorant.            Do not shave 48 hours prior to surgery.             Do not bring valuables to the hospital.            [facility] is not responsible for any belongings or valuables.  Do NOT Smoke (Tobacco/Vaping) or drink Alcohol 24 hours prior to your procedure If you use a CPAP at night, you may bring all equipment for your overnight stay.   Contacts, glasses, dentures or partials may not be worn into surgery, please bring cases for these belongings   For patients admitted to the hospital, discharge time will be determined by your treatment team.   Patients discharged the day of surgery will not be allowed to drive home,  and someone needs to stay with them for 24 hours.    Special instructions:   - Preparing For Surgery  Before surgery, you can play an important role. Because skin is not sterile, your skin needs to be as free of germs as possible. You can reduce the number of germs on your skin by washing with CHG (chlorahexidine gluconate) Soap before surgery.  CHG is an antiseptic cleaner which kills germs and bonds with the skin to continue killing germs even after washing.    Oral Hygiene is also important to reduce your risk of infection.  Remember - BRUSH YOUR TEETH THE MORNING OF SURGERY WITH YOUR REGULAR TOOTHPASTE  Please do not use if you have an allergy to CHG or antibacterial soaps. If your skin becomes reddened/irritated stop using the CHG.  Do not shave (including legs and underarms) for at least 48 hours prior to first CHG shower. It is OK to shave your face.  Please follow these instructions carefully.   1. Shower the NIGHT BEFORE SURGERY and the MORNING OF SURGERY  2. If you chose to wash your hair, wash your hair first as usual with your normal shampoo.  3. After you shampoo, rinse your hair and body thoroughly to remove the shampoo.  4. Use CHG Soap as you would any other liquid soap. You can apply CHG directly to the skin and wash gently with a scrungie or a clean washcloth.   5. Apply the CHG Soap to your body ONLY FROM THE NECK DOWN.  Do not use on open wounds or open sores. Avoid contact with your eyes, ears, mouth and genitals (private parts). Wash Face and genitals (private parts)  with your normal soap.   6. Wash thoroughly, paying special attention to the area where your surgery will be performed.  7. Thoroughly rinse your body with warm water from the neck down.  8. DO NOT shower/wash with your normal soap after using and rinsing off the CHG Soap.  9. Pat yourself dry with a CLEAN TOWEL.  10. Wear CLEAN PAJAMAS to bed the night before surgery  11. Place CLEAN  SHEETS on your bed the night before your surgery  12. DO NOT SLEEP WITH PETS.   Day of Surgery: Take a shower with CHG soap. Wear Clean/Comfortable clothing the morning of surgery Do not apply any deodorants/lotions.   Remember to brush your teeth WITH YOUR REGULAR TOOTHPASTE.   Please read over the following fact sheets that you were given.

## 2021-01-30 ENCOUNTER — Encounter (HOSPITAL_COMMUNITY): Payer: Self-pay

## 2021-01-30 ENCOUNTER — Inpatient Hospital Stay (HOSPITAL_COMMUNITY)
Admission: RE | Admit: 2021-01-30 | Discharge: 2021-01-30 | Disposition: A | Discharge status: Home / Self Care | Payer: Medicare Other | Source: Ambulatory Visit

## 2021-01-30 HISTORY — DX: Depression, unspecified: F32.A

## 2021-01-30 HISTORY — DX: Foot drop, right foot: M21.371

## 2021-01-30 HISTORY — DX: Pneumonia, unspecified organism: J18.9

## 2021-01-30 HISTORY — DX: Unspecified osteoarthritis, unspecified site: M19.90

## 2021-01-30 HISTORY — DX: Unspecified asthma, uncomplicated: J45.909

## 2021-01-30 NOTE — Progress Notes (Signed)
SDW CALL COMPLETE  Pt unable to attend PAT appointment d/t transportation. Completed PAT appointment over the phone. Instructions given.   PCP - Dr. Sarah Martinique Cardiologist - Dr. Adrian Prows  Chest x-ray - n/a EKG - DOS Stress Test - denies ECHO - denies Cardiac Cath - denies  Sleep Study - denies CPAP - denies  Blood Thinner Instructions: n/a Aspirin Instructions: n/a  COVID TEST- Will need DOS.  Anesthesia review: No   Patient denies shortness of breath, fever, cough and chest pain.               Your procedure is scheduled on Thursday April 28th.             Report to [facility] Main Entrance "A" at 0530 A.M., then check in with the Admitting office.             Call this number if you have problems the morning of surgery:             [phone]               Remember:             Do not eat or drink after midnight the night before your surgery                          Take these medicines the morning of surgery with A SIP OF WATER              metoprolol tartrate (LOPRESSOR)             montelukast (SINGULAIR)             Oxcarbazepine (TRILEPTAL)             sertraline (ZOLOFT)             tamsulosin (FLOMAX)              fluticasone furoate-vilanterol (BREO ELLIPTA)               Take these medicines if needed             albuterol (VENTOLIN HFA)- Please bring all inhalers with you on the day of surgery             budesonide-formoterol (SYMBICORT)             cyclobenzaprine (FLEXERIL)             linaclotide (LINZESS)               methocarbamol (ROBAXIN)             promethazine (PHENERGAN)             traMADol (ULTRAM)                As of today, STOP taking any Aspirin (unless otherwise instructed by your surgeon) Aleve, Naproxen, Ibuprofen, Motrin, Advil, Goody's, BC's, all herbal medications, fish oil, and all vitamins.  Do not wear jewelry, make up, or nail polish Do notwear lotions, powders, perfumes  or deodorant. Do notshave 48 hours prior to surgery.  Do notbring valuables to the hospital. [facility] is not responsible for any belongings or valuables.  Do NOT Smoke (Tobacco/Vaping) 24 hours prior to your procedure  Contacts, glasses, dentures or partials may not be worn into surgery, please bring cases for these belongings All instructions explained to the patient, with a verbal understanding of the material. The  opportunity to ask questions was provided.

## 2021-01-31 NOTE — Anesthesia Preprocedure Evaluation (Addendum)
Anesthesia Evaluation  Patient identified by MRN, date of birth, ID band Patient awake    Reviewed: Allergy & Precautions, H&P , NPO status , Patient's Chart, lab work & pertinent test results  Airway Mallampati: II  TM Distance: >3 FB Neck ROM: Full    Dental no notable dental hx. (+) Edentulous Upper, Dental Advisory Given   Pulmonary asthma , Current Smoker and Patient abstained from smoking.,    Pulmonary exam normal breath sounds clear to auscultation       Cardiovascular Exercise Tolerance: Good hypertension, Pt. on medications and Pt. on home beta blockers  Rhythm:Regular Rate:Normal     Neuro/Psych  Headaches, Anxiety Depression    GI/Hepatic negative GI ROS, Neg liver ROS,   Endo/Other  Hyperthyroidism   Renal/GU negative Renal ROS  negative genitourinary   Musculoskeletal  (+) Arthritis , Osteoarthritis,    Abdominal   Peds  Hematology negative hematology ROS (+)   Anesthesia Other Findings   Reproductive/Obstetrics negative OB ROS                            Anesthesia Physical Anesthesia Plan  ASA: II  Anesthesia Plan: General   Post-op Pain Management:    Induction: Intravenous  PONV Risk Score and Plan: 3 and Ondansetron, Dexamethasone and Midazolam  Airway Management Planned: Oral ETT  Additional Equipment:   Intra-op Plan:   Post-operative Plan: Extubation in OR  Informed Consent: I have reviewed the patients History and Physical, chart, labs and discussed the procedure including the risks, benefits and alternatives for the proposed anesthesia with the patient or authorized representative who has indicated his/her understanding and acceptance.     Dental advisory given  Plan Discussed with: CRNA  Anesthesia Plan Comments:        Anesthesia Quick Evaluation

## 2021-02-01 ENCOUNTER — Inpatient Hospital Stay (HOSPITAL_COMMUNITY): Payer: Medicare Other | Admitting: Anesthesiology

## 2021-02-01 ENCOUNTER — Inpatient Hospital Stay (HOSPITAL_COMMUNITY)
Admission: RE | Admit: 2021-02-01 | Discharge: 2021-02-02 | DRG: 472 | Disposition: A | Discharge status: Home / Self Care | Payer: Medicare Other | Attending: Neurosurgery | Admitting: Neurosurgery

## 2021-02-01 ENCOUNTER — Inpatient Hospital Stay (HOSPITAL_COMMUNITY): Payer: Medicare Other

## 2021-02-01 ENCOUNTER — Encounter (HOSPITAL_COMMUNITY): Payer: Self-pay | Admitting: Neurosurgery

## 2021-02-01 ENCOUNTER — Inpatient Hospital Stay (HOSPITAL_COMMUNITY)
Admission: RE | Disposition: A | Discharge status: Home / Self Care | Payer: Self-pay | Source: Home / Self Care | Attending: Neurosurgery

## 2021-02-01 ENCOUNTER — Other Ambulatory Visit: Payer: Self-pay

## 2021-02-01 DIAGNOSIS — Z79899 Other long term (current) drug therapy: Secondary | ICD-10-CM

## 2021-02-01 DIAGNOSIS — Z888 Allergy status to other drugs, medicaments and biological substances status: Secondary | ICD-10-CM

## 2021-02-01 DIAGNOSIS — Z20822 Contact with and (suspected) exposure to covid-19: Secondary | ICD-10-CM | POA: Diagnosis present

## 2021-02-01 DIAGNOSIS — M4722 Other spondylosis with radiculopathy, cervical region: Secondary | ICD-10-CM | POA: Diagnosis present

## 2021-02-01 DIAGNOSIS — Z8051 Family history of malignant neoplasm of kidney: Secondary | ICD-10-CM

## 2021-02-01 DIAGNOSIS — F17210 Nicotine dependence, cigarettes, uncomplicated: Secondary | ICD-10-CM | POA: Diagnosis present

## 2021-02-01 DIAGNOSIS — Z880 Allergy status to penicillin: Secondary | ICD-10-CM | POA: Diagnosis not present

## 2021-02-01 DIAGNOSIS — M50122 Cervical disc disorder at C5-C6 level with radiculopathy: Secondary | ICD-10-CM | POA: Diagnosis not present

## 2021-02-01 DIAGNOSIS — Z91041 Radiographic dye allergy status: Secondary | ICD-10-CM | POA: Diagnosis not present

## 2021-02-01 DIAGNOSIS — M50022 Cervical disc disorder at C5-C6 level with myelopathy: Secondary | ICD-10-CM | POA: Diagnosis not present

## 2021-02-01 DIAGNOSIS — Z91013 Allergy to seafood: Secondary | ICD-10-CM

## 2021-02-01 DIAGNOSIS — M4802 Spinal stenosis, cervical region: Secondary | ICD-10-CM | POA: Diagnosis present

## 2021-02-01 DIAGNOSIS — M4712 Other spondylosis with myelopathy, cervical region: Secondary | ICD-10-CM | POA: Diagnosis not present

## 2021-02-01 DIAGNOSIS — Z419 Encounter for procedure for purposes other than remedying health state, unspecified: Secondary | ICD-10-CM

## 2021-02-01 DIAGNOSIS — Z881 Allergy status to other antibiotic agents status: Secondary | ICD-10-CM

## 2021-02-01 DIAGNOSIS — Z7951 Long term (current) use of inhaled steroids: Secondary | ICD-10-CM

## 2021-02-01 HISTORY — PX: ANTERIOR CERVICAL DECOMP/DISCECTOMY FUSION: SHX1161

## 2021-02-01 LAB — BASIC METABOLIC PANEL
Anion gap: 6 (ref 5–15)
BUN: 16 mg/dL (ref 6–20)
CO2: 25 mmol/L (ref 22–32)
Calcium: 8.8 mg/dL — ABNORMAL LOW (ref 8.9–10.3)
Chloride: 106 mmol/L (ref 98–111)
Creatinine, Ser: 0.98 mg/dL (ref 0.44–1.00)
GFR, Estimated: >60 mL/min (ref >60)
Glucose, Bld: 102 mg/dL — ABNORMAL HIGH (ref 70–99)
Potassium: 4.5 mmol/L (ref 3.5–5.1)
Sodium: 137 mmol/L (ref 135–145)

## 2021-02-01 LAB — CBC
HCT: 40.5 % (ref 36.0–46.0)
Hemoglobin: 13.2 g/dL (ref 12.0–15.0)
MCH: 30.5 pg (ref 26.0–34.0)
MCHC: 32.6 g/dL (ref 30.0–36.0)
MCV: 93.5 fL (ref 80.0–100.0)
Platelets: 192 10*3/uL (ref 150–400)
RBC: 4.33 MIL/uL (ref 3.87–5.11)
RDW: 13.1 % (ref 11.5–15.5)
WBC: 5.2 10*3/uL (ref 4.0–10.5)
nRBC: 0 % (ref 0.0–0.2)

## 2021-02-01 LAB — ABO/RH: ABO/RH(D): O POS

## 2021-02-01 LAB — SURGICAL PCR SCREEN
MRSA, PCR: NEGATIVE
Staphylococcus aureus: NEGATIVE

## 2021-02-01 LAB — SARS CORONAVIRUS 2 BY RT PCR (HOSPITAL ORDER, PERFORMED IN ~~LOC~~ HOSPITAL LAB): SARS Coronavirus 2: NEGATIVE

## 2021-02-01 LAB — TYPE AND SCREEN
ABO/RH(D): O POS
Antibody Screen: NEGATIVE

## 2021-02-01 SURGERY — ANTERIOR CERVICAL DECOMPRESSION/DISCECTOMY FUSION 3 LEVELS
Anesthesia: General | Site: Neck

## 2021-02-01 MED ORDER — PROPOFOL 10 MG/ML IV BOLUS
INTRAVENOUS | Status: DC | PRN
  Administered 2021-02-01: 120 mg via INTRAVENOUS

## 2021-02-01 MED ORDER — LACTATED RINGERS IV SOLN: INTRAVENOUS | Status: DC | PRN

## 2021-02-01 MED ORDER — MENTHOL 3 MG MT LOZG: 1.0000 | LOZENGE | OROMUCOSAL | Status: DC | PRN

## 2021-02-01 MED ORDER — SUCCINYLCHOLINE CHLORIDE 200 MG/10ML IV SOSY
PREFILLED_SYRINGE | INTRAVENOUS | Status: AC
  Filled 2021-02-01: qty 10

## 2021-02-01 MED ORDER — METOPROLOL TARTRATE 12.5 MG HALF TABLET
25.0000 mg | ORAL_TABLET | Freq: Once | ORAL | Status: AC
  Administered 2021-02-01: 25 mg via ORAL

## 2021-02-01 MED ORDER — PROPOFOL 10 MG/ML IV BOLUS
INTRAVENOUS | Status: AC
  Filled 2021-02-01: qty 20

## 2021-02-01 MED ORDER — ONDANSETRON HCL 4 MG/2ML IJ SOLN
INTRAMUSCULAR | Status: AC
  Filled 2021-02-01: qty 2

## 2021-02-01 MED ORDER — CYCLOBENZAPRINE HCL 10 MG PO TABS
ORAL_TABLET | ORAL | Status: AC
  Filled 2021-02-01: qty 1

## 2021-02-01 MED ORDER — EPHEDRINE 5 MG/ML INJ
INTRAVENOUS | Status: AC
  Filled 2021-02-01: qty 10

## 2021-02-01 MED ORDER — BACITRACIN ZINC 500 UNIT/GM EX OINT
TOPICAL_OINTMENT | CUTANEOUS | Status: AC
  Filled 2021-02-01: qty 28.35

## 2021-02-01 MED ORDER — DOCUSATE SODIUM 100 MG PO CAPS
100.0000 mg | ORAL_CAPSULE | Freq: Two times a day (BID) | ORAL | Status: DC
  Administered 2021-02-01 – 2021-02-02 (×2): 100 mg via ORAL
  Filled 2021-02-01 (×2): qty 1

## 2021-02-01 MED ORDER — ONDANSETRON HCL 4 MG/2ML IJ SOLN
INTRAMUSCULAR | Status: DC | PRN
  Administered 2021-02-01: 4 mg via INTRAVENOUS

## 2021-02-01 MED ORDER — FENTANYL CITRATE (PF) 250 MCG/5ML IJ SOLN
INTRAMUSCULAR | Status: DC | PRN
  Administered 2021-02-01: 50 ug via INTRAVENOUS
  Administered 2021-02-01: 100 ug via INTRAVENOUS
  Administered 2021-02-01: 50 ug via INTRAVENOUS

## 2021-02-01 MED ORDER — VANCOMYCIN HCL 1000 MG/200ML IV SOLN
1000.0000 mg | INTRAVENOUS | Status: DC
  Administered 2021-02-02: 1000 mg via INTRAVENOUS
  Filled 2021-02-01: qty 200

## 2021-02-01 MED ORDER — FLUTICASONE FUROATE-VILANTEROL 100-25 MCG/INH IN AEPB
1.0000 | INHALATION_SPRAY | Freq: Every day | RESPIRATORY_TRACT | Status: DC
  Filled 2021-02-01: qty 28

## 2021-02-01 MED ORDER — ALBUTEROL SULFATE HFA 108 (90 BASE) MCG/ACT IN AERS: 1.0000 | INHALATION_SPRAY | RESPIRATORY_TRACT | Status: DC | PRN

## 2021-02-01 MED ORDER — BACITRACIN ZINC 500 UNIT/GM EX OINT
TOPICAL_OINTMENT | CUTANEOUS | Status: DC | PRN
  Administered 2021-02-01: 1 via TOPICAL

## 2021-02-01 MED ORDER — BUPIVACAINE-EPINEPHRINE (PF) 0.25% -1:200000 IJ SOLN
INTRAMUSCULAR | Status: AC
  Filled 2021-02-01: qty 30

## 2021-02-01 MED ORDER — DEXAMETHASONE SODIUM PHOSPHATE 10 MG/ML IJ SOLN
INTRAMUSCULAR | Status: AC
  Filled 2021-02-01: qty 1

## 2021-02-01 MED ORDER — PHENYLEPHRINE 40 MCG/ML (10ML) SYRINGE FOR IV PUSH (FOR BLOOD PRESSURE SUPPORT)
PREFILLED_SYRINGE | INTRAVENOUS | Status: AC
  Filled 2021-02-01: qty 10

## 2021-02-01 MED ORDER — FENTANYL CITRATE (PF) 250 MCG/5ML IJ SOLN
INTRAMUSCULAR | Status: AC
  Filled 2021-02-01: qty 5

## 2021-02-01 MED ORDER — ACETAMINOPHEN 500 MG PO TABS
1000.0000 mg | ORAL_TABLET | Freq: Once | ORAL | Status: AC
  Administered 2021-02-01: 1000 mg via ORAL
  Filled 2021-02-01: qty 2

## 2021-02-01 MED ORDER — HYDROCHLOROTHIAZIDE 12.5 MG PO CAPS: 12.5000 mg | ORAL_CAPSULE | Freq: Every day | ORAL | Status: DC

## 2021-02-01 MED ORDER — ROCURONIUM BROMIDE 10 MG/ML (PF) SYRINGE
PREFILLED_SYRINGE | INTRAVENOUS | Status: AC
  Filled 2021-02-01: qty 10

## 2021-02-01 MED ORDER — LIDOCAINE 2% (20 MG/ML) 5 ML SYRINGE
INTRAMUSCULAR | Status: AC
  Filled 2021-02-01: qty 5

## 2021-02-01 MED ORDER — VANCOMYCIN HCL 1000 MG/200ML IV SOLN: 1000.0000 mg | INTRAVENOUS | Status: DC

## 2021-02-01 MED ORDER — MORPHINE SULFATE (PF) 4 MG/ML IV SOLN
4.0000 mg | INTRAVENOUS | Status: DC | PRN
  Administered 2021-02-01: 4 mg via INTRAVENOUS
  Filled 2021-02-01: qty 1

## 2021-02-01 MED ORDER — TRAMADOL HCL 50 MG PO TABS: 50.0000 mg | ORAL_TABLET | Freq: Four times a day (QID) | ORAL | Status: DC | PRN

## 2021-02-01 MED ORDER — ROCURONIUM BROMIDE 10 MG/ML (PF) SYRINGE
PREFILLED_SYRINGE | INTRAVENOUS | Status: DC | PRN
  Administered 2021-02-01: 50 mg via INTRAVENOUS
  Administered 2021-02-01: 30 mg via INTRAVENOUS
  Administered 2021-02-01: 20 mg via INTRAVENOUS

## 2021-02-01 MED ORDER — LACTATED RINGERS IV SOLN: INTRAVENOUS | Status: DC

## 2021-02-01 MED ORDER — PROMETHAZINE HCL 25 MG PO TABS: 25.0000 mg | ORAL_TABLET | Freq: Four times a day (QID) | ORAL | Status: DC | PRN

## 2021-02-01 MED ORDER — MIDAZOLAM HCL 2 MG/2ML IJ SOLN
INTRAMUSCULAR | Status: DC | PRN
  Administered 2021-02-01: 2 mg via INTRAVENOUS

## 2021-02-01 MED ORDER — EPHEDRINE SULFATE-NACL 50-0.9 MG/10ML-% IV SOSY
PREFILLED_SYRINGE | INTRAVENOUS | Status: DC | PRN
  Administered 2021-02-01 (×2): 10 mg via INTRAVENOUS

## 2021-02-01 MED ORDER — CHLORHEXIDINE GLUCONATE 0.12 % MT SOLN
OROMUCOSAL | Status: AC
  Administered 2021-02-01: 15 mL
  Filled 2021-02-01: qty 15

## 2021-02-01 MED ORDER — LINACLOTIDE 145 MCG PO CAPS: 145.0000 ug | ORAL_CAPSULE | Freq: Every day | ORAL | Status: DC | PRN

## 2021-02-01 MED ORDER — THROMBIN 5000 UNITS EX SOLR
CUTANEOUS | Status: AC
  Filled 2021-02-01: qty 5000

## 2021-02-01 MED ORDER — SERTRALINE HCL 50 MG PO TABS: 25.0000 mg | ORAL_TABLET | Freq: Every day | ORAL | Status: DC

## 2021-02-01 MED ORDER — CYCLOBENZAPRINE HCL 5 MG PO TABS
5.0000 mg | ORAL_TABLET | Freq: Three times a day (TID) | ORAL | Status: DC | PRN
  Administered 2021-02-01 – 2021-02-02 (×3): 5 mg via ORAL
  Filled 2021-02-01 (×2): qty 1

## 2021-02-01 MED ORDER — ONDANSETRON HCL 4 MG/2ML IJ SOLN: 4.0000 mg | Freq: Four times a day (QID) | INTRAMUSCULAR | Status: DC | PRN

## 2021-02-01 MED ORDER — OXCARBAZEPINE 300 MG PO TABS: 300.0000 mg | ORAL_TABLET | Freq: Two times a day (BID) | ORAL | Status: DC

## 2021-02-01 MED ORDER — DEXAMETHASONE SODIUM PHOSPHATE 4 MG/ML IJ SOLN
4.0000 mg | Freq: Four times a day (QID) | INTRAMUSCULAR | Status: AC
  Administered 2021-02-01 (×2): 4 mg via INTRAVENOUS
  Filled 2021-02-01 (×2): qty 1

## 2021-02-01 MED ORDER — ONDANSETRON HCL 4 MG PO TABS: 4.0000 mg | ORAL_TABLET | Freq: Four times a day (QID) | ORAL | Status: DC | PRN

## 2021-02-01 MED ORDER — ALUM & MAG HYDROXIDE-SIMETH 200-200-20 MG/5ML PO SUSP: 30.0000 mL | Freq: Four times a day (QID) | ORAL | Status: DC | PRN

## 2021-02-01 MED ORDER — DEXAMETHASONE SODIUM PHOSPHATE 10 MG/ML IJ SOLN
INTRAMUSCULAR | Status: DC | PRN
  Administered 2021-02-01: 10 mg via INTRAVENOUS

## 2021-02-01 MED ORDER — OXYCODONE HCL 5 MG PO TABS
10.0000 mg | ORAL_TABLET | ORAL | Status: DC | PRN
  Administered 2021-02-01 – 2021-02-02 (×5): 10 mg via ORAL
  Filled 2021-02-01 (×4): qty 2

## 2021-02-01 MED ORDER — METOPROLOL TARTRATE 25 MG PO TABS
25.0000 mg | ORAL_TABLET | Freq: Two times a day (BID) | ORAL | Status: DC
  Administered 2021-02-02: 25 mg via ORAL
  Filled 2021-02-01 (×2): qty 1

## 2021-02-01 MED ORDER — PHENYLEPHRINE 40 MCG/ML (10ML) SYRINGE FOR IV PUSH (FOR BLOOD PRESSURE SUPPORT)
PREFILLED_SYRINGE | INTRAVENOUS | Status: DC | PRN
  Administered 2021-02-01: 80 ug via INTRAVENOUS

## 2021-02-01 MED ORDER — LIDOCAINE 2% (20 MG/ML) 5 ML SYRINGE
INTRAMUSCULAR | Status: DC | PRN
  Administered 2021-02-01: 100 mg via INTRAVENOUS

## 2021-02-01 MED ORDER — CHLORHEXIDINE GLUCONATE CLOTH 2 % EX PADS: 6.0000 | MEDICATED_PAD | Freq: Once | CUTANEOUS | Status: DC

## 2021-02-01 MED ORDER — HYDROMORPHONE HCL 1 MG/ML IJ SOLN
0.2500 mg | INTRAMUSCULAR | Status: DC | PRN
  Administered 2021-02-01 (×4): 0.25 mg via INTRAVENOUS

## 2021-02-01 MED ORDER — THROMBIN 20000 UNITS EX SOLR
CUTANEOUS | Status: AC
  Filled 2021-02-01: qty 20000

## 2021-02-01 MED ORDER — BISACODYL 10 MG RE SUPP: 10.0000 mg | Freq: Every day | RECTAL | Status: DC | PRN

## 2021-02-01 MED ORDER — MIDAZOLAM HCL 2 MG/2ML IJ SOLN
INTRAMUSCULAR | Status: AC
  Filled 2021-02-01: qty 2

## 2021-02-01 MED ORDER — METOPROLOL TARTRATE 12.5 MG HALF TABLET
ORAL_TABLET | ORAL | Status: AC
  Filled 2021-02-01: qty 2

## 2021-02-01 MED ORDER — PANTOPRAZOLE SODIUM 40 MG IV SOLR: 40.0000 mg | Freq: Every day | INTRAVENOUS | Status: DC

## 2021-02-01 MED ORDER — SUGAMMADEX SODIUM 200 MG/2ML IV SOLN
INTRAVENOUS | Status: DC | PRN
  Administered 2021-02-01: 200 mg via INTRAVENOUS

## 2021-02-01 MED ORDER — PHENOL 1.4 % MT LIQD: 1.0000 | OROMUCOSAL | Status: DC | PRN

## 2021-02-01 MED ORDER — ACETAMINOPHEN 500 MG PO TABS: 1000.0000 mg | ORAL_TABLET | Freq: Four times a day (QID) | ORAL | Status: DC

## 2021-02-01 MED ORDER — THROMBIN 5000 UNITS EX SOLR: OROMUCOSAL | Status: DC | PRN

## 2021-02-01 MED ORDER — HYDROMORPHONE HCL 1 MG/ML IJ SOLN
INTRAMUSCULAR | Status: AC
  Filled 2021-02-01: qty 1

## 2021-02-01 MED ORDER — BUPIVACAINE-EPINEPHRINE (PF) 0.25% -1:200000 IJ SOLN
INTRAMUSCULAR | Status: DC | PRN
  Administered 2021-02-01: 10 mL

## 2021-02-01 MED ORDER — DEXAMETHASONE 4 MG PO TABS: 4.0000 mg | ORAL_TABLET | Freq: Four times a day (QID) | ORAL | Status: AC

## 2021-02-01 MED ORDER — OXYCODONE HCL 5 MG PO TABS: 5.0000 mg | ORAL_TABLET | ORAL | Status: DC | PRN

## 2021-02-01 MED ORDER — ALBUMIN HUMAN 5 % IV SOLN: INTRAVENOUS | Status: DC | PRN

## 2021-02-01 MED ORDER — VANCOMYCIN HCL IN DEXTROSE 1-5 GM/200ML-% IV SOLN
1000.0000 mg | INTRAVENOUS | Status: AC
  Administered 2021-02-01 (×2): 1000 mg via INTRAVENOUS
  Filled 2021-02-01: qty 200

## 2021-02-01 MED ORDER — OXYCODONE HCL 5 MG PO TABS
ORAL_TABLET | ORAL | Status: AC
  Filled 2021-02-01: qty 2

## 2021-02-01 MED ORDER — MONTELUKAST SODIUM 10 MG PO TABS: 10.0000 mg | ORAL_TABLET | Freq: Every day | ORAL | Status: DC

## 2021-02-01 MED ORDER — PANTOPRAZOLE SODIUM 40 MG PO TBEC
40.0000 mg | DELAYED_RELEASE_TABLET | Freq: Every day | ORAL | Status: DC
  Administered 2021-02-01 – 2021-02-02 (×2): 40 mg via ORAL
  Filled 2021-02-01 (×2): qty 1

## 2021-02-01 MED ORDER — ACETAMINOPHEN 325 MG PO TABS: 650.0000 mg | ORAL_TABLET | ORAL | Status: DC | PRN

## 2021-02-01 MED ORDER — TAMSULOSIN HCL 0.4 MG PO CAPS: 0.4000 mg | ORAL_CAPSULE | Freq: Every day | ORAL | Status: DC

## 2021-02-01 MED ORDER — ZOLPIDEM TARTRATE 5 MG PO TABS: 5.0000 mg | ORAL_TABLET | Freq: Every evening | ORAL | Status: DC | PRN

## 2021-02-01 MED ORDER — ACETAMINOPHEN 650 MG RE SUPP: 650.0000 mg | RECTAL | Status: DC | PRN

## 2021-02-01 MED ORDER — MOMETASONE FURO-FORMOTEROL FUM 200-5 MCG/ACT IN AERO: 2.0000 | INHALATION_SPRAY | Freq: Two times a day (BID) | RESPIRATORY_TRACT | Status: DC

## 2021-02-01 MED ORDER — 0.9 % SODIUM CHLORIDE (POUR BTL) OPTIME
TOPICAL | Status: DC | PRN
  Administered 2021-02-01: 1000 mL

## 2021-02-01 MED ORDER — PHENYLEPHRINE HCL-NACL 10-0.9 MG/250ML-% IV SOLN
INTRAVENOUS | Status: DC | PRN
  Administered 2021-02-01: 30 ug/min via INTRAVENOUS

## 2021-02-01 SURGICAL SUPPLY — 65 items
APL SKNCLS STERI-STRIP NONHPOA (GAUZE/BANDAGES/DRESSINGS) ×1
BAND INSRT 18 STRL LF DISP RB (MISCELLANEOUS)
BAND RUBBER #18 3X1/16 STRL (MISCELLANEOUS) IMPLANT
BENZOIN TINCTURE PRP APPL 2/3 (GAUZE/BANDAGES/DRESSINGS) ×3 IMPLANT
BIT DRILL NEURO 2X3.1 SFT TUCH (MISCELLANEOUS) ×1 IMPLANT
BLADE SURG 15 STRL LF DISP TIS (BLADE) ×1 IMPLANT
BLADE SURG 15 STRL SS (BLADE) ×2
BLADE ULTRA TIP 2M (BLADE) ×2 IMPLANT
BUR BARREL STRAIGHT FLUTE 4.0 (BURR) ×2 IMPLANT
BUR MATCHSTICK NEURO 3.0 LAGG (BURR) ×2 IMPLANT
CAGE PEEK VISTAS 11X14X6 (Cage) ×3 IMPLANT
CANISTER SUCT 3000ML PPV (MISCELLANEOUS) ×2 IMPLANT
CARTRIDGE OIL MAESTRO DRILL (MISCELLANEOUS) ×1 IMPLANT
COVER MAYO STAND STRL (DRAPES) ×2 IMPLANT
COVER WAND RF STERILE (DRAPES) ×1 IMPLANT
DIFFUSER DRILL AIR PNEUMATIC (MISCELLANEOUS) ×2 IMPLANT
DRAIN JACKSON PRATT 10MM FLAT (MISCELLANEOUS) ×1 IMPLANT
DRAPE LAPAROTOMY 100X72 PEDS (DRAPES) ×2 IMPLANT
DRAPE MICROSCOPE LEICA (MISCELLANEOUS) IMPLANT
DRAPE SURG 17X23 STRL (DRAPES) ×4 IMPLANT
DRILL NEURO 2X3.1 SOFT TOUCH (MISCELLANEOUS) ×2
DRSG OPSITE POSTOP 3X4 (GAUZE/BANDAGES/DRESSINGS) ×1 IMPLANT
DRSG OPSITE POSTOP 4X6 (GAUZE/BANDAGES/DRESSINGS) ×1 IMPLANT
ELECT REM PT RETURN 9FT ADLT (ELECTROSURGICAL) ×2
ELECTRODE REM PT RTRN 9FT ADLT (ELECTROSURGICAL) ×1 IMPLANT
EVACUATOR SILICONE 100CC (DRAIN) ×1 IMPLANT
GAUZE 4X4 16PLY RFD (DISPOSABLE) IMPLANT
GLOVE BIO SURGEON STRL SZ8 (GLOVE) ×2 IMPLANT
GLOVE BIO SURGEON STRL SZ8.5 (GLOVE) ×2 IMPLANT
GLOVE EXAM NITRILE XL STR (GLOVE) IMPLANT
GLOVE SURG LTX SZ7.5 (GLOVE) ×1 IMPLANT
GLOVE SURG POLYISO LF SZ6.5 (GLOVE) ×7 IMPLANT
GLOVE SURG UNDER POLY LF SZ6.5 (GLOVE) ×5 IMPLANT
GLOVE SURG UNDER POLY LF SZ7.5 (GLOVE) ×1 IMPLANT
GOWN STRL REUS W/ TWL LRG LVL3 (GOWN DISPOSABLE) IMPLANT
GOWN STRL REUS W/ TWL XL LVL3 (GOWN DISPOSABLE) ×1 IMPLANT
GOWN STRL REUS W/TWL LRG LVL3 (GOWN DISPOSABLE) ×10
GOWN STRL REUS W/TWL XL LVL3 (GOWN DISPOSABLE) ×2
HEMOSTAT POWDER KIT SURGIFOAM (HEMOSTASIS) ×2 IMPLANT
KIT BASIN OR (CUSTOM PROCEDURE TRAY) ×2 IMPLANT
KIT TURNOVER KIT B (KITS) ×2 IMPLANT
MARKER SKIN DUAL TIP RULER LAB (MISCELLANEOUS) ×2 IMPLANT
NDL SPNL 18GX3.5 QUINCKE PK (NEEDLE) ×1 IMPLANT
NEEDLE HYPO 22GX1.5 SAFETY (NEEDLE) ×2 IMPLANT
NEEDLE SPNL 18GX3.5 QUINCKE PK (NEEDLE) ×2 IMPLANT
NS IRRIG 1000ML POUR BTL (IV SOLUTION) ×2 IMPLANT
OIL CARTRIDGE MAESTRO DRILL (MISCELLANEOUS) ×2
PACK LAMINECTOMY NEURO (CUSTOM PROCEDURE TRAY) ×2 IMPLANT
PATTIES SURGICAL .5 X.5 (GAUZE/BANDAGES/DRESSINGS) ×1 IMPLANT
PATTIES SURGICAL 1X1 (DISPOSABLE) ×1 IMPLANT
PENCIL BUTTON HOLSTER BLD 10FT (ELECTRODE) ×1 IMPLANT
PIN DISTRACTION 14MM (PIN) ×4 IMPLANT
PLATE ANT CERV XTEND 3 LV 42 (Plate) ×1 IMPLANT
PUTTY DBM 5CC CALC GRAN ×1 IMPLANT
SCREW VAR 4.2 XD SELF DRILL 12 (Screw) ×2 IMPLANT
SCREW XTD VAR 4.2 SELF TAP 12 (Screw) ×6 IMPLANT
SPONGE INTESTINAL PEANUT (DISPOSABLE) ×3 IMPLANT
SPONGE SURGIFOAM ABS GEL 100 (HEMOSTASIS) IMPLANT
STRIP CLOSURE SKIN 1/2X4 (GAUZE/BANDAGES/DRESSINGS) ×2 IMPLANT
SUT VIC AB 0 CT1 27 (SUTURE) ×2
SUT VIC AB 0 CT1 27XBRD ANTBC (SUTURE) ×1 IMPLANT
SUT VIC AB 3-0 SH 8-18 (SUTURE) ×2 IMPLANT
TOWEL GREEN STERILE (TOWEL DISPOSABLE) ×2 IMPLANT
TOWEL GREEN STERILE FF (TOWEL DISPOSABLE) ×2 IMPLANT
WATER STERILE IRR 1000ML POUR (IV SOLUTION) ×2 IMPLANT

## 2021-02-01 NOTE — Anesthesia Procedure Notes (Signed)
Procedure Name: Intubation Date/Time: 02/01/2021 7:45 AM Performed by: Darletta Moll, CRNA Pre-anesthesia Checklist: Patient identified, Emergency Drugs available, Suction available and Patient being monitored Patient Re-evaluated:Patient Re-evaluated prior to induction Oxygen Delivery Method: Circle system utilized Preoxygenation: Pre-oxygenation with 100% oxygen Induction Type: IV induction Ventilation: Mask ventilation without difficulty Laryngoscope Size: Glidescope and 3 Grade View: Grade I Tube type: Oral Tube size: 7.0 mm Number of attempts: 1 Airway Equipment and Method: Video-laryngoscopy and Rigid stylet Placement Confirmation: ETT inserted through vocal cords under direct vision,  positive ETCO2 and breath sounds checked- equal and bilateral Secured at: 21 cm Tube secured with: Tape Dental Injury: Teeth and Oropharynx as per pre-operative assessment  Difficulty Due To: Difficult Airway- due to reduced neck mobility

## 2021-02-01 NOTE — Op Note (Signed)
Brief history: The patient is a 55 year old white female who has complained of neck pain, upper extremity and lower extremity numbness, tingling, weakness, spasticity, hyperreflexia, etc.  She was worked up with a cervical MRI which demonstrated cervical spondylosis and stenosis.  I discussed the various treatment options with her.  She has decided to proceed with surgery.  Preoperative diagnosis: C4-5, C5-6 and C6-7 disc degeneration, spondylosis, stenosis, cervicalgia, cervical radiculopathy, cervical myelopathy  Postoperative diagnosis: The same  Procedure: C4-5, C5-6 and C6-7 anterior cervical discectomy/decompression; C4-5, C5-6 and C6-7 interbody arthrodesis with local morcellized autograft bone and Zimmer DBM; insertion of interbody prosthesis at C4-5, C5-6 and C6-7 (Zimmer peek interbody prosthesis); anterior cervical plating from C4-C7 with globus titanium plate  Surgeon: Dr. Earle Gell  Asst.: Dr. Duffy Rhody and Arnetha Massy, NP  Anesthesia: Gen. endotracheal  Estimated blood loss: 125 cc  Drains: 1 Jackson-Pratt drain in the prevertebral space  Complications: None  Description of procedure: The patient was brought to the operating room by the anesthesia team. General endotracheal anesthesia was induced. A roll was placed under the patient's shoulders to keep the neck in the neutral position. The patient's anterior cervical region was then prepared with Betadine scrub and Betadine solution. Sterile drapes were applied.  The area to be incised was then injected with Marcaine with epinephrine solution. I then used a scalpel to make a transverse incision in the patient's left anterior neck. I used the Metzenbaum scissors to divide the platysmal muscle and then to dissect medial to the sternocleidomastoid muscle, jugular vein, and carotid artery. I carefully dissected down towards the anterior cervical spine identifying the esophagus and retracting it medially. Then using Kitner  swabs to clear soft tissue from the anterior cervical spine. We then inserted a bent spinal needle into the upper exposed intervertebral disc space. We then obtained intraoperative radiographs confirm our location.  I then used electrocautery to detach the medial border of the longus colli muscle bilaterally from the C4-5, C5-6 and C6-7 intervertebral disc spaces. I then inserted the Caspar self-retaining retractor underneath the longus colli muscle bilaterally to provide exposure.  We then incised the intervertebral disc at C4-5. We then performed a partial intervertebral discectomy with a pituitary forceps and the Karlin curettes. I then inserted distraction screws into the vertebral bodies at C4 and C5. We then distracted the interspace. We then used the high-speed drill to decorticate the vertebral endplates at W1-0, to drill away the remainder of the intervertebral disc, to drill away some posterior spondylosis, and to thin out the posterior longitudinal ligament. I then incised ligament with the arachnoid knife. We then removed the ligament with a Kerrison punches undercutting the vertebral endplates and decompressing the thecal sac. We then performed foraminotomies about the bilateral C5 nerve roots. This completed the decompression at this level.  We then repeated this procedure in analogous fashion at C5-6 and C6-7 decompressing the thecal sac and the bilateral C6 and C7 nerve roots.  We now turned our to attention to the interbody fusion. We used the trial spacers to determine the appropriate size for the interbody prosthesis. We then pre-filled prosthesis with a combination of local morcellized autograft bone that we obtained during decompression as well as Zimmer DBM. We then inserted the prosthesis into the distracted interspace at C4-5, C5-6 and C6-7. We then removed the distraction screws. There was a good snug fit of the prosthesis in the interspace.  Having completed the fusion we now  turned attention to the anterior  spinal instrumentation. We used the high-speed drill to drill away some anterior spondylosis at the disc spaces so that the plate lay down flat. We selected the appropriate length titanium anterior cervical plate. We laid it along the anterior aspect of the vertebral bodies from C4-C7. We then drilled 12 mm holes at C4, C5, C6 and C7. We then secured the plate to the vertebral bodies by placing two 12 mm self-tapping screws at C4, C5, C6 and C7. We then obtained intraoperative radiograph. The demonstrating good position of the i upper plate, we cannot see the lower part of the instrumentation because of the patient's shoulders.  The construct looked good in vivo.  We therefore secured the screws the plate the locking each cam. This completed the instrumentation.  We then obtained hemostasis using bipolar electrocautery. We irrigated the wound out with bacitracin solution. We then removed the retractor. We inspected the esophagus for any damage. There was none apparent.  We placed a Jackson-Pratt drain in the prevertebral space and tunneled it out through a separate stab wound.  We then reapproximated patient's platysmal muscle with interrupted 3-0 Vicryl suture. We then reapproximated the subcutaneous tissue with interrupted 3-0 Vicryl suture. The skin was reapproximated with Steri-Strips and benzoin. The wound was then covered with bacitracin ointment. A sterile dressing was applied. The drapes were removed. Patient was subsequently extubated by the anesthesia team and transported to the post anesthesia care unit in stable condition. All sponge instrument and needle counts were reportedly correct at the end of this case.

## 2021-02-01 NOTE — Progress Notes (Signed)
Orthopedic Tech Progress Note Patient Details:  Bridget [name] 1966-04-09 [id number] Patient has brace Patient ID: [name], female   DOB: Jun 25, 1966, 55 y.o.   MRN: [id number]   [name] 02/01/2021, 7:59 PM

## 2021-02-01 NOTE — Progress Notes (Signed)
Pharmacy Antibiotic Note  Bridget [name] is a 55 y.o. female admitted on 02/01/2021 s/p Anterior cervical decompression/discectomy/fusion now with drain requiring surgical prophylaxis.  Pharmacy has been consulted for vancomycin dosing given reported anaphylaxis to PCNs.   Confirmed patient has drain in per note and RN. Patient received 2g Vancomycin at 7:30am per charting. Clcr 66 ml/min.   Plan: Start vancomycin 1g Q24hr F/u drain removal and DC vancomycin  Height: 5\' 2"  (157.5 cm) Weight: 83.9 kg (185 lb) IBW/kg (Calculated) : 50.1  Temp (24hrs), Avg:98.1 F (36.7 C), Min:97.8 F (36.6 C), Max:98.6 F (37 C)  Recent Labs  Lab 02/01/21 0546  WBC 5.2  CREATININE 0.98    Estimated Creatinine Clearance: 66.7 mL/min (by C-G formula based on SCr of 0.98 mg/dL).    Allergies  Allergen Reactions  . Penicillins Anaphylaxis, Rash and Swelling    Throat swelling Swelling/throat closes   . Amoxicillin Swelling and Other (See Comments)    Eyes swelling  . Shellfish Allergy Itching  . Penicillin G Other (See Comments)    Unknown  . Celecoxib Itching  . Iodine-131 Nausea And Vomiting and Rash    Must be injected slowly or pt will vomit     Thank you for allowing pharmacy to be a part of this patient's care.  Benetta Spar, PharmD, BCPS, BCCP Clinical Pharmacist  Please check AMION for all [place] phone numbers After 10:00 PM, call [place] [phone]

## 2021-02-01 NOTE — Progress Notes (Signed)
Subjective: The patient is alert and pleasant.  Objective: Vital signs in last 24 hours: Temp:  [97.9 F (36.6 C)-98.6 F (37 C)] 97.9 F (36.6 C) (04/28 1110) Pulse Rate:  [66-80] 69 (04/28 1140) Resp:  [18-23] 20 (04/28 1140) BP: (104-138)/(63-80) 104/69 (04/28 1140) SpO2:  [90 %-100 %] 90 % (04/28 1140) Weight:  [83.9 kg] 83.9 kg (04/28 0551) Estimated body mass index is 33.84 kg/m as calculated from the following:   Height as of this encounter: 5\' 2"  (1.575 m).   Weight as of this encounter: 83.9 kg.   Intake/Output from previous day: No intake/output data recorded. Intake/Output this shift: Total I/O In: 2010 [I.V.:1500; IV Piggyback:510] Out: 400 [Urine:100; Blood:300]  Physical exam patient is alert and pleasant.  She is moving all 4 extremities well without obvious weakness.  Her dressing is clean dry without hematoma or shift.  Lab Results: Recent Labs    02/01/21 0546  WBC 5.2  HGB 13.2  HCT 40.5  PLT 192   BMET Recent Labs    02/01/21 0546  NA 137  K 4.5  CL 106  CO2 25  GLUCOSE 102*  BUN 16  CREATININE 0.98  CALCIUM 8.8*    Studies/Results: DG Cervical Spine 2 or 3 views  Result Date: 02/01/2021 CLINICAL DATA:  C4-5 surgery. EXAM: CERVICAL SPINE - 2-3 VIEW COMPARISON:  07/11/2020 FINDINGS: Initial image shows a needle marking the anterior disc space of C3-4. Second film shows cervical ACDF beginning at C4-5. Inferior aspect not well seen because of shoulder density, but I suspect this is from C4-C7. IMPRESSION: ACDF beginning at C4-5. Inferior aspect not well seen because of shoulder density. I suspect this is from C4-C7. Electronically Signed   By: Nelson Chimes M.D.   On: 02/01/2021 11:51    Assessment/Plan: The patient is doing well.  I spoke with her sister.  LOS: 0 days     Ophelia Charter 02/01/2021, 12:03 PM

## 2021-02-01 NOTE — Anesthesia Postprocedure Evaluation (Signed)
Anesthesia Post Note  Patient: Bridget [name]  Procedure(s) Performed: Cervical four-five Cervical five-six Cervical six-seven Anterior cervical decompression/discectomy/fusion with interbody prosthesis, plate, screws (N/A Neck)     Patient location during evaluation: PACU Anesthesia Type: General Level of consciousness: awake and alert Pain management: pain level controlled Vital Signs Assessment: post-procedure vital signs reviewed and stable Respiratory status: spontaneous breathing, nonlabored ventilation and respiratory function stable Cardiovascular status: blood pressure returned to baseline and stable Postop Assessment: no apparent nausea or vomiting Anesthetic complications: no   No complications documented.  Last Vitals:  Vitals:   02/01/21 1240 02/01/21 1340  BP: 108/72 106/72  Pulse: 66 72  Resp: 15 18  Temp:  36.6 C  SpO2: 94% 95%    Last Pain:  Vitals:   02/01/21 1340  TempSrc:   PainSc: 4                  Kratos Ruscitti,W. EDMOND

## 2021-02-01 NOTE — Transfer of Care (Signed)
Immediate Anesthesia Transfer of Care Note  Patient: Bridget [name]  Procedure(s) Performed: Cervical four-five Cervical five-six Cervical six-seven Anterior cervical decompression/discectomy/fusion with interbody prosthesis, plate, screws (N/A Neck)  Patient Location: PACU  Anesthesia Type:General  Level of Consciousness: drowsy and patient cooperative  Airway & Oxygen Therapy: Patient Spontanous Breathing  Post-op Assessment: Report given to RN and Post -op Vital signs reviewed and stable  Post vital signs: Reviewed and stable  Last Vitals:  Vitals Value Taken Time  BP 120/80 02/01/21 1110  Temp    Pulse 78 02/01/21 1111  Resp 19 02/01/21 1111  SpO2 92 % 02/01/21 1111  Vitals shown include unvalidated device data.  Last Pain:  Vitals:   02/01/21 0605  TempSrc:   PainSc: 0-No pain         Complications: No complications documented.

## 2021-02-01 NOTE — H&P (Signed)
Subjective: The patient is a 55 year old white female who has complained of neck and left great and right arm pain consistent with a cervical radiculopathy.  She has failed medical management and was worked up with a cervical MRI.  This demonstrated spondylosis and stenosis at C4-5, C5-6 and C6-7.  I discussed the various treatment options with her.  She has decided proceed with surgery.  Past Medical History:  Diagnosis Date  . Arthritis   . Asthma   . Back pain   . Depression   . Foot drop, right foot   . Neuropathy   . Pneumonia   . Spinal stenosis     Past Surgical History:  Procedure Laterality Date  . ABDOMINAL HYSTERECTOMY    . BREAST LUMPECTOMY Right    benign  . CHOLECYSTECTOMY    . TUBAL LIGATION      Allergies  Allergen Reactions  . Penicillins Anaphylaxis, Rash and Swelling    Throat swelling Swelling/throat closes   . Amoxicillin Swelling and Other (See Comments)    Eyes swelling  . Shellfish Allergy Itching  . Penicillin G Other (See Comments)    Unknown  . Celecoxib Itching  . Iodine-131 Nausea And Vomiting and Rash    Must be injected slowly or pt will vomit    Social History   Tobacco Use  . Smoking status: Current Every Day Smoker    Packs/day: 0.25    Types: Cigarettes  . Smokeless tobacco: Never Used  . Tobacco comment: 3 cigarettes a day  Substance Use Topics  . Alcohol use: No    Alcohol/week: 0.0 standard drinks    Family History  Problem Relation Age of Onset  . Kidney cancer Mother    Prior to Admission medications   Medication Sig Start Date End Date Taking? Authorizing Provider  budesonide-formoterol (SYMBICORT) 160-4.5 MCG/ACT inhaler Inhale 2 puffs into the lungs daily as needed (shortness of breath).   Yes [provider]  cyclobenzaprine (FLEXERIL) 5 MG tablet Take 5 mg by mouth 3 (three) times daily as needed for muscle spasms. 07/07/18  Yes [provider]  fluticasone furoate-vilanterol (BREO ELLIPTA) 100-25  MCG/INH AEPB Take 1 puff by mouth daily. 03/08/18  Yes [provider]  hydrochlorothiazide (MICROZIDE) 12.5 MG capsule Take 12.5 mg by mouth daily.   Yes [provider]  linaclotide (LINZESS) 290 MCG CAPS capsule Take by mouth daily as needed (constipation).   Yes [provider]  methocarbamol (ROBAXIN) 750 MG tablet Take 750 mg by mouth every 8 (eight) hours as needed for muscle spasms.   Yes [provider]  metoprolol tartrate (LOPRESSOR) 25 MG tablet Take 25 mg by mouth 2 (two) times daily.   Yes [provider]  montelukast (SINGULAIR) 10 MG tablet Take 10 mg by mouth daily. 07/04/18  Yes [provider]  Oxcarbazepine (TRILEPTAL) 300 MG tablet TAKE 1 TABLET BY MOUTH IN THE MORNING AND TAKE 2 TABLETS BY MOUTH AT BEDTIME Patient taking differently: Take 300 mg by mouth 2 (two) times daily. 11/20/20  Yes Sater, Nanine Means, MD  promethazine (PHENERGAN) 25 MG tablet Take 25 mg by mouth every 6 (six) hours as needed for nausea or vomiting.   Yes [provider]  sertraline (ZOLOFT) 25 MG tablet Take 25 mg by mouth daily.   Yes [provider]  tamsulosin (FLOMAX) 0.4 MG CAPS capsule Take 0.4 mg by mouth.   Yes [provider]  traMADol (ULTRAM) 50 MG tablet Take 50 mg by  mouth daily as needed for severe pain.   Yes [provider]  albuterol (VENTOLIN HFA) 108 (90 Base) MCG/ACT inhaler Inhale 1-2 puffs into the lungs every 4 (four) hours as needed for wheezing or shortness of breath. 04/08/18   [provider]  baclofen (LIORESAL) 10 MG tablet Take 1 tablet (10 mg total) by mouth 3 (three) times daily. Patient not taking: Reported on 01/25/2021 05/16/20   Britt Bottom, MD  Multiple Vitamin (MULTIVITAMIN WITH MINERALS) TABS tablet Take 1 tablet by mouth daily.    [provider]  naproxen (NAPROSYN) 500 MG tablet Take 1 tablet (500 mg total) by mouth 2 (two) times daily as needed. Patient not  taking: Reported on 01/25/2021 08/28/20   Britt Bottom, MD     Review of Systems  Positive ROS: As above  All other systems have been reviewed and were otherwise negative with the exception of those mentioned in the HPI and as above.  Objective: Vital signs in last 24 hours: Temp:  [98.6 F (37 C)] 98.6 F (37 C) (04/28 0551) Pulse Rate:  [66] 66 (04/28 0551) Resp:  [18] 18 (04/28 0551) BP: (138)/(77) 138/77 (04/28 0551) SpO2:  [100 %] 100 % (04/28 0551) Weight:  [83.9 kg] 83.9 kg (04/28 0551) Estimated body mass index is 33.84 kg/m as calculated from the following:   Height as of this encounter: 5\' 2"  (1.575 m).   Weight as of this encounter: 83.9 kg.   General Appearance: Alert Head: Normocephalic, without obvious abnormality, atraumatic Eyes: PERRL, conjunctiva/corneas clear, EOM's intact,    Ears: Normal  Throat: Normal  Neck: Limited range of motion.  Positive Spurling's testing. Back: unremarkable Lungs: Clear to auscultation bilaterally, respirations unlabored Heart: Regular rate and rhythm, no murmur, rub or gallop Abdomen: Soft, non-tender Extremities: Extremities normal, atraumatic, no cyanosis or edema Skin: unremarkable  NEUROLOGIC:   Mental status: alert and oriented,Motor Exam - grossly normal Sensory Exam - grossly normal Reflexes:  Coordination - grossly normal Gait - grossly normal Balance - grossly normal Cranial Nerves: I: smell Not tested  II: visual acuity  OS: Normal  OD: Normal   II: visual fields Full to confrontation  II: pupils Equal, round, reactive to light  III,VII: ptosis None  III,IV,VI: extraocular muscles  Full ROM  V: mastication Normal  V: facial light touch sensation  Normal  V,VII: corneal reflex  Present  VII: facial muscle function - upper  Normal  VII: facial muscle function - lower Normal  VIII: hearing Not tested  IX: soft palate elevation  Normal  IX,X: gag reflex Present  XI: trapezius strength  5/5  XI:  sternocleidomastoid strength 5/5  XI: neck flexion strength  5/5  XII: tongue strength  Normal    Data Review Lab Results  Component Value Date   WBC 5.2 02/01/2021   HGB 13.2 02/01/2021   HCT 40.5 02/01/2021   MCV 93.5 02/01/2021   PLT 192 02/01/2021   Lab Results  Component Value Date   NA 137 02/01/2021   K 4.5 02/01/2021   CL 106 02/01/2021   CO2 25 02/01/2021   BUN 16 02/01/2021   CREATININE 0.98 02/01/2021   GLUCOSE 102 (H) 02/01/2021   No results found for: INR, PROTIME  Assessment/Plan: C4-5, C5-6 and C6-7 spondylosis, cervicalgia, cervical radiculopathy: I have discussed the situation with the patient.  I reviewed the imaging studies with her.  I pointed out the abnormalities.  We have discussed the various treatment options including  surgery.  I have described the surgical treatment option of a C4-5, C5-6 and C6-7 anterior cervicectomy, fusion and plating.  I have shown her surgical models.  I have given her a surgical pamphlet.  We have discussed the risk, benefits, alternatives, expected postop course, and likelihood of achieving our goals with surgery.  I have answered all her questions.  She has decided proceed with surgery.   Ophelia Charter 02/01/2021 7:23 AM

## 2021-02-02 ENCOUNTER — Encounter (HOSPITAL_COMMUNITY): Payer: Self-pay | Admitting: Neurosurgery

## 2021-02-02 MED ORDER — OXYCODONE-ACETAMINOPHEN 5-325 MG PO TABS: 1.0000 | ORAL_TABLET | ORAL | Status: DC | PRN

## 2021-02-02 MED ORDER — DOCUSATE SODIUM 100 MG PO CAPS
100.0000 mg | ORAL_CAPSULE | Freq: Two times a day (BID) | ORAL | 0 refills | Status: DC
Start: 2021-02-02 — End: 2022-12-21

## 2021-02-02 MED ORDER — OXYCODONE-ACETAMINOPHEN 5-325 MG PO TABS: 1.0000 | ORAL_TABLET | ORAL | 0 refills | Status: DC | PRN

## 2021-02-02 MED FILL — Thrombin For Soln 5000 Unit: CUTANEOUS | Qty: 5000 | Status: AC

## 2021-02-02 NOTE — Progress Notes (Signed)
Patient is discharged form room 3C07 at this time. Alert and in stable condition. IV site d/c'd and instructions read to patient and sister with understanding verbalized and all questions answered. Left unit via wheelchair with all belongings at side.

## 2021-02-02 NOTE — Evaluation (Signed)
Occupational Therapy Evaluation Patient Details Name: Bridget [name] MRN: [id number] DOB: 29-Dec-1965 Today's Date: 02/02/2021    History of Present Illness Pt is a 55 y/o female admitted 02/01/21 for C4-5, C5-6 and C6-7 anterior cervical discectomy, fusion and plating. Pt has a PMH including Arthritis, Asthma, Back pain, Depression, Foot drop, right foot, Neuropathy, Pneumonia, and Spinal stenosis   Clinical Impression   Pt is typically mod I for ADL/IADL and transfers at [facility] level. She has approriate equipment in home environment - but plans to go stay with her mom for first 3 weeks post-op who can assist with ADL as well as take care of IADL. Her roommate is not there all the time to assist in her own home. Pt was able to demonstrate bed mobility at min A to come to EOB; educated in log roll technique. Pt able to demonstrate figure 4 method for access to LB for dressing/bathing and min guard for squat pivot to [facility] - however transfer was very quick/impulsive and required multiple attempts for initial boost off the bed. She reports this is how she does it at home - she would benefit from continued education and technique to improve safety for transfers. She was able to clean peri area with min guard and lateral leans with significant pain. Pt practiced don/doff of C Collar, and handout reviewed with cervical precautions and compensatory strategies to maintain precautions during ADL. OT stressed importance of wearing Collar at all times for safety when OOB. At this time recommending continued OT services while acute and afterwards at the [facility] level to maximize safety and independence in ADL and functional transfers.    Follow Up Recommendations  Home health OT;Supervision/Assistance - 24 hour (initially)    Equipment Recommendations  None recommended by OT (Pt has appropriate DME)    Recommendations for Other Services       Precautions / Restrictions Precautions Precautions: Cervical Precaution  Booklet Issued: Yes (comment) Required Braces or Orthoses: Cervical Brace Cervical Brace: Other (comment) (when OOB) Restrictions Weight Bearing Restrictions: No      Mobility Bed Mobility Overal bed mobility: Needs Assistance Bed Mobility: Rolling;Sidelying to Sit;Sit to Sidelying Rolling: Min guard Sidelying to sit: Mod assist;HOB elevated     Sit to sidelying: Mod assist (assist for BLE back into bed) General bed mobility comments: Pt verbalized "I know how to log roll" but required multimodal cues for safe completion    Transfers Overall transfer level: Needs assistance Equipment used:  (BSC) Transfers: Squat Pivot Transfers     Squat pivot transfers: Min guard     General transfer comment: very fast, multiple attempts for momentum and to clear rump from bed. suspect baseline - but unsafe. Would beenfit from continued transfer education and safety education    Balance Overall balance assessment: Mild deficits observed, not formally tested                                         ADL either performed or assessed with clinical judgement   ADL Overall ADL's : Needs assistance/impaired Eating/Feeding: Set up;Sitting   Grooming: Set up;Sitting   Upper Body Bathing: Minimal assistance;Sitting Upper Body Bathing Details (indicate cue type and reason): mother is able to assist Lower Body Bathing: Min guard;Sitting/lateral leans Lower Body Bathing Details (indicate cue type and reason): can perform figure 4 to access LB for bathing Upper Body Dressing : Set up;Sitting  Upper Body Dressing Details (indicate cue type and reason): Pt did doff and don cervical brace Lower Body Dressing: Moderate assistance;Bed level Lower Body Dressing Details (indicate cue type and reason): Pt can perform figure 4, but required assist at the bed level to don briefs Toilet Transfer: Min Contractor Details (indicate cue type and reason): very fast, Pt  reports baseline, required several attempts and stabilization of the [facility] by therapist, she was able to complete without physical assist - but it was relatively unsafe Toileting- Clothing Manipulation and Hygiene: Min guard;Sitting/lateral lean Toileting - Clothing Manipulation Details (indicate cue type and reason): multiple attempts and very painful/very effortful     Functional mobility during ADLs: Min guard;Cueing for safety (squat pivot only) General ADL Comments: provided cervical handout and reviewed., Pt would benefit from continued reinforcement of precautions and compensatory strategies for ADL     Vision Baseline Vision/History: Wears glasses Wears Glasses: Reading only Patient Visual Report: No change from baseline       Perception     Praxis      Pertinent Vitals/Pain Pain Assessment: 0-10 Pain Score: 7  Pain Location: incision site Pain Descriptors / Indicators: Discomfort;Grimacing;Operative site guarding;Sore;Spasm Pain Intervention(s): Limited activity within patient's tolerance;Monitored during session;Repositioned     Hand Dominance     Extremity/Trunk Assessment Upper Extremity Assessment Upper Extremity Assessment: Overall WFL for tasks assessed   Lower Extremity Assessment Lower Extremity Assessment: Generalized weakness (at baseline per Pt)   Cervical / Trunk Assessment Cervical / Trunk Assessment: Other exceptions Cervical / Trunk Exceptions: s/p cervical sx in hard C Collar   Communication Communication Communication: No difficulties   Cognition Arousal/Alertness: Awake/alert Behavior During Therapy: WFL for tasks assessed/performed;Impulsive Overall Cognitive Status: No family/caregiver present to determine baseline cognitive functioning Area of Impairment: Safety/judgement                         Safety/Judgement: Decreased awareness of safety         General Comments       Exercises     Shoulder Instructions      Home  Living Family/patient expects to be discharged to:: Private residence Living Arrangements: Parent (PLans to stay with her mom for first 3 weeks) Available Help at Discharge: Family;Available 24 hours/day Type of Home: House Home Access: Ramped entrance     Home Layout: One level     Bathroom Shower/Tub: Occupational psychologist: Standard Bathroom Accessibility: Yes How Accessible: Accessible via wheelchair Home Equipment: Shower seat;Grab bars - toilet;Grab bars - tub/shower;Hand held shower head;Wheelchair - Press photographer;Bedside commode          Prior Functioning/Environment Level of Independence: Independent with assistive device(s)        Comments: uses WC or power scooter to get around, mod I independent in ADL/IADL        OT Problem List: Decreased activity tolerance;Impaired balance (sitting and/or standing);Decreased safety awareness;Decreased knowledge of use of DME or AE;Pain      OT Treatment/Interventions: Self-care/ADL training;Energy conservation;DME and/or AE instruction;Therapeutic activities;Patient/family education;Balance training    OT Goals(Current goals can be found in the care plan section) Acute Rehab OT Goals Patient Stated Goal: To decrease pain and be safer OT Goal Formulation: With patient Time For Goal Achievement: 02/16/21 Potential to Achieve Goals: Good ADL Goals Pt Will Perform Grooming: with modified independence;sitting Pt Will Perform Upper Body Dressing: with modified independence;sitting Pt Will Perform Lower Body Dressing: sitting/lateral leans;with supervision  Pt Will Transfer to Toilet: with modified independence;squat pivot transfer;bedside commode Pt Will Perform Toileting - Clothing Manipulation and hygiene: with modified independence;sitting/lateral leans Pt Will Perform Tub/Shower Transfer: Shower transfer;with supervision;Stand pivot transfer;shower seat  OT Frequency: Min 2X/week   Barriers to D/C:             Co-evaluation              AM-PAC OT "6 Clicks" Daily Activity     Outcome Measure Help from another person eating meals?: None Help from another person taking care of personal grooming?: A Little Help from another person toileting, which includes using toliet, bedpan, or urinal?: A Little Help from another person bathing (including washing, rinsing, drying)?: A Little Help from another person to put on and taking off regular upper body clothing?: A Little Help from another person to put on and taking off regular lower body clothing?: A Lot 6 Click Score: 18   End of Session Equipment Utilized During Treatment: Cervical collar Nurse Communication: Mobility status;Precautions  Activity Tolerance: Patient tolerated treatment well Patient left: in bed;with call bell/phone within reach;with bed alarm set;with SCD's reapplied  OT Visit Diagnosis: Other abnormalities of gait and mobility (R26.89);Muscle weakness (generalized) (M62.81);Other symptoms and signs involving cognitive function;Pain Pain - Right/Left:  (central) Pain - part of body:  (cervical)                Time: 4656-8127 OT Time Calculation (min): 36 min Charges:  OT General Charges $OT Visit: 1 Visit OT Evaluation $OT Eval Moderate Complexity: 1 Mod OT Treatments $Self Care/Home Management : 8-22 mins  Jesse Sans OTR/L Acute Rehabilitation Services Pager: [phone] Office: [place] 02/02/2021, 12:22 PM

## 2021-02-02 NOTE — Discharge Instructions (Signed)

## 2021-02-02 NOTE — Discharge Summary (Signed)
Physician Discharge Summary  Patient ID: Bridget [name] MRN: [id number] DOB/AGE: 12-26-1965 55 y.o.  Admit date: 02/01/2021 Discharge date: 02/02/2021  Admission Diagnoses: Cervical spondylosis, cervical stenosis, cervicalgia, cervical radiculopathy, cervical myelopathy Discharge Diagnoses: The same Active Problems:   Cervical spondylosis with myelopathy and radiculopathy   Discharged Condition: good  Hospital Course: I performed a C4-5, C5-6 and C6-7 anterior cervical discectomy, fusion and plating on the patient on 01/24/2021.  The surgery went well.  The patient's postoperative course was unremarkable.  On postoperative day I she looked and felt well.  Her numbness was gone.  She requested discharge home.  She was given written and oral discharge instructions.  All questions were answered.  Consults: OT Significant Diagnostic Studies: None Treatments: C4-5, C5-6 and C6-7 anterior cervical discectomy, fusion and plating. Discharge Exam: Blood pressure 108/66, pulse 81, temperature 98.7 F (37.1 C), temperature source Oral, resp. rate 18, height 5\' 2"  (1.575 m), weight 83.9 kg, last menstrual period 10/24/2014, SpO2 97 %. The patient is alert and pleasant.  Her strength is normal.  Her dressing is clean and dry.  There is no hematoma or shift.  Disposition: Home  Discharge Instructions    Call MD for:  difficulty breathing, headache or visual disturbances   Complete by: As directed    Call MD for:  extreme fatigue   Complete by: As directed    Call MD for:  hives   Complete by: As directed    Call MD for:  persistant dizziness or light-headedness   Complete by: As directed    Call MD for:  persistant nausea and vomiting   Complete by: As directed    Call MD for:  redness, tenderness, or signs of infection (pain, swelling, redness, odor or green/yellow discharge around incision site)   Complete by: As directed    Call MD for:  severe uncontrolled pain   Complete by: As directed     Call MD for:  temperature >100.4   Complete by: As directed    Diet - low sodium heart healthy   Complete by: As directed    Discharge instructions   Complete by: As directed    Call [phone] for a followup appointment. Take a stool softener while you are using pain medications.   Driving Restrictions   Complete by: As directed    Do not drive for 2 weeks.   Increase activity slowly   Complete by: As directed    Lifting restrictions   Complete by: As directed    Do not lift more than 5 pounds. No excessive bending or twisting.   May shower / Bathe   Complete by: As directed    Remove the dressing for 3 days after surgery.  You may shower, but leave the incision alone.   Remove dressing in 48 hours   Complete by: As directed      Allergies as of 02/02/2021      Reactions   Penicillins Anaphylaxis, Rash, Swelling   Throat swelling Swelling/throat closes    Amoxicillin Swelling, Other (See Comments)   Eyes swelling   Shellfish Allergy Itching   Penicillin G Other (See Comments)   Unknown   Celecoxib Itching   Iodine-131 Nausea And Vomiting, Rash   Must be injected slowly or pt will vomit      Medication List    STOP taking these medications   baclofen 10 MG tablet Commonly known as: LIORESAL   methocarbamol 750 MG tablet Commonly known as: ROBAXIN  naproxen 500 MG tablet Commonly known as: NAPROSYN   traMADol 50 MG tablet Commonly known as: ULTRAM     TAKE these medications   albuterol 108 (90 Base) MCG/ACT inhaler Commonly known as: VENTOLIN HFA Inhale 1-2 puffs into the lungs every 4 (four) hours as needed for wheezing or shortness of breath.   budesonide-formoterol 160-4.5 MCG/ACT inhaler Commonly known as: SYMBICORT Inhale 2 puffs into the lungs daily as needed (shortness of breath).   cyclobenzaprine 5 MG tablet Commonly known as: FLEXERIL Take 5 mg by mouth 3 (three) times daily as needed for muscle spasms.   docusate sodium 100 MG  capsule Commonly known as: COLACE Take 1 capsule (100 mg total) by mouth 2 (two) times daily.   fluticasone furoate-vilanterol 100-25 MCG/INH Aepb Commonly known as: BREO ELLIPTA Take 1 puff by mouth daily.   hydrochlorothiazide 12.5 MG capsule Commonly known as: MICROZIDE Take 12.5 mg by mouth daily.   linaclotide 290 MCG Caps capsule Commonly known as: LINZESS Take by mouth daily as needed (constipation).   metoprolol tartrate 25 MG tablet Commonly known as: LOPRESSOR Take 25 mg by mouth 2 (two) times daily.   montelukast 10 MG tablet Commonly known as: SINGULAIR Take 10 mg by mouth daily.   multivitamin with minerals Tabs tablet Take 1 tablet by mouth daily.   Oxcarbazepine 300 MG tablet Commonly known as: TRILEPTAL TAKE 1 TABLET BY MOUTH IN THE MORNING AND TAKE 2 TABLETS BY MOUTH AT BEDTIME What changed:   how much to take  how to take this  when to take this  additional instructions   oxyCODONE-acetaminophen 5-325 MG tablet Commonly known as: PERCOCET/ROXICET Take 1-2 tablets by mouth every 4 (four) hours as needed for moderate pain.   promethazine 25 MG tablet Commonly known as: PHENERGAN Take 25 mg by mouth every 6 (six) hours as needed for nausea or vomiting.   sertraline 25 MG tablet Commonly known as: ZOLOFT Take 25 mg by mouth daily.   tamsulosin 0.4 MG Caps capsule Commonly known as: FLOMAX Take 0.4 mg by mouth.        Signed: Ophelia Charter 02/02/2021, 6:35 AM

## 2021-05-01 ENCOUNTER — Encounter: Payer: Self-pay | Admitting: Physical Medicine & Rehabilitation

## 2021-05-03 ENCOUNTER — Telehealth: Payer: Self-pay | Admitting: Neurology

## 2021-05-03 NOTE — Telephone Encounter (Signed)
LVM returning pt call. 

## 2021-05-03 NOTE — Telephone Encounter (Signed)
Pt called needing to speak to the RN regarding her pain management. Please advise.

## 2021-06-27 ENCOUNTER — Other Ambulatory Visit: Payer: Self-pay | Admitting: Student

## 2021-06-27 DIAGNOSIS — M5416 Radiculopathy, lumbar region: Secondary | ICD-10-CM

## 2021-07-10 ENCOUNTER — Encounter: Payer: Medicare Other | Attending: Physical Medicine & Rehabilitation | Admitting: Physical Medicine & Rehabilitation

## 2021-07-10 ENCOUNTER — Other Ambulatory Visit: Payer: Self-pay

## 2021-07-10 ENCOUNTER — Encounter: Payer: Self-pay | Admitting: Physical Medicine & Rehabilitation

## 2021-07-10 VITALS — BP 99/74 | HR 81 | Temp 98.5°F | Ht 62.0 in | Wt 165.0 lb

## 2021-07-10 DIAGNOSIS — G35 Multiple sclerosis: Secondary | ICD-10-CM | POA: Insufficient documentation

## 2021-07-10 DIAGNOSIS — G8220 Paraplegia, unspecified: Secondary | ICD-10-CM | POA: Insufficient documentation

## 2021-07-10 MED ORDER — PREGABALIN 25 MG PO CAPS: 25.0000 mg | ORAL_CAPSULE | Freq: Three times a day (TID) | ORAL | 1 refills | Status: DC

## 2021-07-10 MED ORDER — BACLOFEN 10 MG PO TABS: 10.0000 mg | ORAL_TABLET | Freq: Three times a day (TID) | ORAL | 1 refills | Status: DC

## 2021-07-10 NOTE — Progress Notes (Signed)
Subjective:    Patient ID: Bridget [name], female    DOB: April 07, 1966, 55 y.o.   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  She has gone through inpatient rehabilitation in [place].  She follows with Dr. 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  Her last MRI of the thoracic spine was performed 07/11/2020 showing some cord atrophy at T4-T5, consistent with sequelae of transverse myelitis.  Cervical spine MRI on 07/11/2020 showed moderate central canal stenosis at C5-6 with moderate to severe foraminal stenosis at C4-5 on the right side greater than left side. Leg an foot burning pain  ACDF C4-5 , C5-6 Dr Arnoldo Morale April 2022, great improvement with Left hand weakness The patient complains of jumping in both legs this occurs mainly with position changes such as coming up to a stand it does limit her standing.  She does not require a walker to take a couple steps but mainly is in a power chair.  She is able to transfer to and from chair to her bed or to the toilet. She does not have any pelvic problems with bowel control but does have some bladder urgency she has not required any intermittent catheterization.  In addition to the shaking in the legs she also complains of hypersensitive to touch in both legs and in fact this starts right below her bellybutton. Has not been on baclofen she does not recall being on gabapentin.  She has no allergies to these medications Pain Inventory Average Pain 8 Pain Right Now 9 My pain is sharp, burning, stabbing, tingling, and aching  In the last 24 hours, has pain interfered with the following? General activity 8 Relation with others 8 Enjoyment of life 8 What TIME of day is your pain at its worst? morning , daytime, evening, and night Sleep (in general) Poor  Pain is worse with: walking, bending, and standing Pain  improves with: rest, medication, and TENS Relief from Meds: 5  walk with assistance use a walker ability to climb steps?  no do you drive?  no use a wheelchair transfers alone  disabled: date disabled 04/06/12 I need assistance with the following:  household duties and shopping  bladder control problems bowel control problems weakness numbness tremor tingling trouble walking spasms depression anxiety  New pt  Primary care [place] [facility] [place] [facility] [place] New pt   Family History  Problem Relation Age of Onset   Kidney cancer Mother    Social History   Socioeconomic History   Marital status: Divorced    Spouse name: Not on file   Number of children: Not on file   Years of education: Not on file   Highest education level: Not on file  Occupational History   Not on file  Tobacco Use   Smoking status: Every Day    Packs/day: 0.25    Types: Cigarettes   Smokeless tobacco: Never   Tobacco comments:    3 cigarettes a day  Vaping Use   Vaping Use: Never used  Substance and Sexual Activity   Alcohol use: No    Alcohol/week: 0.0 standard drinks   Drug use: No   Sexual activity: Not on file  Other Topics Concern   Not on file  Social History Narrative   ** Merged History Encounter **   Lives with boyfriend, [name].  Education 12th grade diploma.  Unemployed.  2 children.     Caffeine use: Dr pepper sometimes   Tea daily    Left handed    Social Determinants of Health   Financial Resource Strain: Not on file  Food Insecurity: Not on file  Transportation Needs: Not on file  Physical Activity: Not on file  Stress: Not on file  Social Connections: Not on file   Past Surgical History:  Procedure Laterality Date   ABDOMINAL HYSTERECTOMY     ANTERIOR CERVICAL DECOMP/DISCECTOMY FUSION N/A 02/01/2021   Procedure: Cervical four-five Cervical five-six Cervical six-seven Anterior cervical decompression/discectomy/fusion with interbody  prosthesis, plate, screws;  Surgeon: Newman Pies, MD;  Location: [place];  Service: Neurosurgery;  Laterality: N/A;   BREAST LUMPECTOMY Right    benign   CHOLECYSTECTOMY     TUBAL LIGATION     Past Medical History:  Diagnosis Date   Arthritis    Asthma    Back pain    Depression    Foot drop, right foot    Neuropathy    Pneumonia    Spinal stenosis    BP 99/74   Pulse 81   Temp 98.5 F (36.9 C) (Oral)   Ht 5\' 2"  (1.575 m)   Wt 165 lb (74.8 kg)   LMP 10/24/2014   SpO2 93%   BMI 30.18 kg/m   Opioid Risk Score:   Fall Risk Score:  `1  Depression screen PHQ 2/9  Depression screen PHQ 2/9 07/10/2021  Decreased Interest 2  Down, Depressed, Hopeless 1  PHQ - 2 Score 3  Altered sleeping 2  Tired, decreased energy 1  Change in appetite 1  Feeling bad or failure about yourself  1  Trouble concentrating 1  Moving slowly or fidgety/restless 1  Suicidal thoughts 0  PHQ-9 Score 10  Difficult doing work/chores Somewhat difficult      Review of Systems  Musculoskeletal:  Positive for back pain and gait problem.       Leg pain spasms  Neurological:  Positive for dizziness, tremors, weakness and numbness. Negative for seizures.  Psychiatric/Behavioral:  Positive for dysphoric mood. The patient is nervous/anxious.   All other systems reviewed and are negative.     Objective:   Physical Exam Vitals and nursing note reviewed.  Constitutional:      Appearance: She is obese.  HENT:     Head: Normocephalic and atraumatic.  Eyes:     Extraocular Movements: Extraocular movements intact.     Conjunctiva/sclera: Conjunctivae normal.     Pupils: Pupils are equal, round, and reactive to light.  Cardiovascular:     Heart sounds: Normal heart sounds.  Pulmonary:     Effort: Pulmonary effort is normal. No respiratory distress.     Breath sounds: Normal breath sounds.  Abdominal:     General: Bowel sounds are normal. There is no distension.     Palpations: Abdomen is soft.   Neurological:     Mental Status: She is alert and oriented to person, place, and time.     Cranial Nerves: No dysarthria or facial asymmetry.     Motor: Weakness present.     Gait: Gait abnormal.     Comments: Hypersensitivity to touch starting at the umbilicus and involving both lower limbs. Upper extremity strength is 5/5 in the deltoid, bicep, tricep, grip Lower extremity strength is influenced by tone the patient has extensor spasms with endrange clonus and has difficulty relaxing in fact requires physical assistance to flex the knees once again.  Psychiatric:  Mood and Affect: Mood normal.        Behavior: Behavior normal.          Assessment & Plan:  1.  History of transverse myelitis with spastic paraplegia she has mainly lower extremity sensory spasms which interfere with transfers.  A likely contributor to her ability to stand however.  We discussed different treatment options she has not been on baclofen will start this at 10 mg 3 times daily may need to titrate upward.  We also discussed botulinum toxin injections which at this time patient would like to avoid because she does not like injections.  We also discussed the possibility of requiring baclofen pump should last invasive methods fail to control her spasticity Return to clinic in 4 to 6 weeks

## 2021-07-10 NOTE — Patient Instructions (Signed)
Pregabalin for skin hypersensitivity  Baclofen for leg shaking

## 2021-09-04 ENCOUNTER — Encounter: Payer: Medicare Other | Admitting: Physical Medicine & Rehabilitation

## 2022-01-07 ENCOUNTER — Other Ambulatory Visit: Payer: Self-pay | Admitting: Student

## 2022-01-07 DIAGNOSIS — M5416 Radiculopathy, lumbar region: Secondary | ICD-10-CM

## 2022-01-13 ENCOUNTER — Other Ambulatory Visit: Payer: Medicare Other

## 2022-01-21 ENCOUNTER — Ambulatory Visit
Admission: RE | Admit: 2022-01-21 | Discharge: 2022-01-21 | Disposition: A | Discharge status: Home / Self Care | Payer: Medicare Other | Source: Ambulatory Visit | Attending: Student | Admitting: Student

## 2022-01-21 DIAGNOSIS — M5416 Radiculopathy, lumbar region: Secondary | ICD-10-CM

## 2022-12-20 ENCOUNTER — Other Ambulatory Visit: Payer: Self-pay

## 2022-12-20 ENCOUNTER — Inpatient Hospital Stay (HOSPITAL_COMMUNITY)
Admission: EM | Admit: 2022-12-20 | Discharge: 2022-12-25 | DRG: 799 | Disposition: A | Discharge status: Home Health | Payer: 59 | Attending: Surgery | Admitting: Surgery

## 2022-12-20 ENCOUNTER — Encounter (HOSPITAL_COMMUNITY): Payer: Self-pay | Admitting: Emergency Medicine

## 2022-12-20 ENCOUNTER — Emergency Department (HOSPITAL_COMMUNITY): Payer: 59

## 2022-12-20 DIAGNOSIS — M25551 Pain in right hip: Secondary | ICD-10-CM | POA: Diagnosis present

## 2022-12-20 DIAGNOSIS — D62 Acute posthemorrhagic anemia: Secondary | ICD-10-CM | POA: Diagnosis present

## 2022-12-20 DIAGNOSIS — M545 Low back pain, unspecified: Secondary | ICD-10-CM | POA: Diagnosis present

## 2022-12-20 DIAGNOSIS — Z880 Allergy status to penicillin: Secondary | ICD-10-CM | POA: Diagnosis not present

## 2022-12-20 DIAGNOSIS — K661 Hemoperitoneum: Secondary | ICD-10-CM | POA: Diagnosis present

## 2022-12-20 DIAGNOSIS — G801 Spastic diplegic cerebral palsy: Secondary | ICD-10-CM | POA: Diagnosis present

## 2022-12-20 DIAGNOSIS — R0902 Hypoxemia: Secondary | ICD-10-CM | POA: Diagnosis not present

## 2022-12-20 DIAGNOSIS — Z993 Dependence on wheelchair: Secondary | ICD-10-CM

## 2022-12-20 DIAGNOSIS — G4700 Insomnia, unspecified: Secondary | ICD-10-CM | POA: Diagnosis present

## 2022-12-20 DIAGNOSIS — G629 Polyneuropathy, unspecified: Secondary | ICD-10-CM | POA: Diagnosis present

## 2022-12-20 DIAGNOSIS — Y92410 Unspecified street and highway as the place of occurrence of the external cause: Secondary | ICD-10-CM | POA: Diagnosis not present

## 2022-12-20 DIAGNOSIS — I959 Hypotension, unspecified: Secondary | ICD-10-CM | POA: Diagnosis not present

## 2022-12-20 DIAGNOSIS — K5900 Constipation, unspecified: Secondary | ICD-10-CM | POA: Diagnosis present

## 2022-12-20 DIAGNOSIS — R339 Retention of urine, unspecified: Secondary | ICD-10-CM | POA: Diagnosis present

## 2022-12-20 DIAGNOSIS — F430 Acute stress reaction: Secondary | ICD-10-CM | POA: Diagnosis present

## 2022-12-20 DIAGNOSIS — F329 Major depressive disorder, single episode, unspecified: Secondary | ICD-10-CM | POA: Diagnosis present

## 2022-12-20 DIAGNOSIS — E669 Obesity, unspecified: Secondary | ICD-10-CM | POA: Diagnosis present

## 2022-12-20 DIAGNOSIS — N319 Neuromuscular dysfunction of bladder, unspecified: Secondary | ICD-10-CM | POA: Diagnosis present

## 2022-12-20 DIAGNOSIS — F17210 Nicotine dependence, cigarettes, uncomplicated: Secondary | ICD-10-CM | POA: Diagnosis present

## 2022-12-20 DIAGNOSIS — Z79899 Other long term (current) drug therapy: Secondary | ICD-10-CM

## 2022-12-20 DIAGNOSIS — Z886 Allergy status to analgesic agent status: Secondary | ICD-10-CM

## 2022-12-20 DIAGNOSIS — Z91041 Radiographic dye allergy status: Secondary | ICD-10-CM

## 2022-12-20 DIAGNOSIS — J45909 Unspecified asthma, uncomplicated: Secondary | ICD-10-CM | POA: Diagnosis present

## 2022-12-20 DIAGNOSIS — S3609XA Other injury of spleen, initial encounter: Secondary | ICD-10-CM | POA: Diagnosis present

## 2022-12-20 DIAGNOSIS — K592 Neurogenic bowel, not elsewhere classified: Secondary | ICD-10-CM | POA: Diagnosis present

## 2022-12-20 DIAGNOSIS — S36031A Moderate laceration of spleen, initial encounter: Secondary | ICD-10-CM | POA: Diagnosis present

## 2022-12-20 DIAGNOSIS — S3600XA Unspecified injury of spleen, initial encounter: Principal | ICD-10-CM

## 2022-12-20 DIAGNOSIS — Z7951 Long term (current) use of inhaled steroids: Secondary | ICD-10-CM

## 2022-12-20 DIAGNOSIS — I10 Essential (primary) hypertension: Secondary | ICD-10-CM | POA: Diagnosis present

## 2022-12-20 DIAGNOSIS — M25521 Pain in right elbow: Secondary | ICD-10-CM | POA: Diagnosis present

## 2022-12-20 DIAGNOSIS — Z981 Arthrodesis status: Secondary | ICD-10-CM

## 2022-12-20 DIAGNOSIS — Z6831 Body mass index (BMI) 31.0-31.9, adult: Secondary | ICD-10-CM

## 2022-12-20 DIAGNOSIS — G8929 Other chronic pain: Secondary | ICD-10-CM | POA: Diagnosis present

## 2022-12-20 DIAGNOSIS — R1012 Left upper quadrant pain: Secondary | ICD-10-CM | POA: Diagnosis present

## 2022-12-20 DIAGNOSIS — Z8701 Personal history of pneumonia (recurrent): Secondary | ICD-10-CM

## 2022-12-20 DIAGNOSIS — Z9049 Acquired absence of other specified parts of digestive tract: Secondary | ICD-10-CM

## 2022-12-20 DIAGNOSIS — Z90710 Acquired absence of both cervix and uterus: Secondary | ICD-10-CM

## 2022-12-20 DIAGNOSIS — Z91013 Allergy to seafood: Secondary | ICD-10-CM

## 2022-12-20 DIAGNOSIS — Z9851 Tubal ligation status: Secondary | ICD-10-CM

## 2022-12-20 LAB — CBC WITH DIFFERENTIAL/PLATELET
Abs Immature Granulocytes: 0.05 10*3/uL (ref 0.00–0.07)
Basophils Absolute: 0 10*3/uL (ref 0.0–0.1)
Basophils Relative: 0 %
Eosinophils Absolute: 0.1 10*3/uL (ref 0.0–0.5)
Eosinophils Relative: 1 %
HCT: 39.2 % (ref 36.0–46.0)
Hemoglobin: 13.2 g/dL (ref 12.0–15.0)
Immature Granulocytes: 0 %
Lymphocytes Relative: 24 %
Lymphs Abs: 3 10*3/uL (ref 0.7–4.0)
MCH: 31 pg (ref 26.0–34.0)
MCHC: 33.7 g/dL (ref 30.0–36.0)
MCV: 92 fL (ref 80.0–100.0)
Monocytes Absolute: 0.5 10*3/uL (ref 0.1–1.0)
Monocytes Relative: 4 %
Neutro Abs: 9.1 10*3/uL — ABNORMAL HIGH (ref 1.7–7.7)
Neutrophils Relative %: 71 %
Platelets: 267 10*3/uL (ref 150–400)
RBC: 4.26 MIL/uL (ref 3.87–5.11)
RDW: 12.6 % (ref 11.5–15.5)
WBC: 12.8 10*3/uL — ABNORMAL HIGH (ref 4.0–10.5)
nRBC: 0 % (ref 0.0–0.2)

## 2022-12-20 LAB — BASIC METABOLIC PANEL
Anion gap: 13 (ref 5–15)
BUN: 10 mg/dL (ref 6–20)
CO2: 22 mmol/L (ref 22–32)
Calcium: 9 mg/dL (ref 8.9–10.3)
Chloride: 105 mmol/L (ref 98–111)
Creatinine, Ser: 1.23 mg/dL — ABNORMAL HIGH (ref 0.44–1.00)
GFR, Estimated: 52 mL/min — ABNORMAL LOW (ref >60)
Glucose, Bld: 120 mg/dL — ABNORMAL HIGH (ref 70–99)
Potassium: 4.4 mmol/L (ref 3.5–5.1)
Sodium: 140 mmol/L (ref 135–145)

## 2022-12-20 LAB — I-STAT CHEM 8, ED
BUN: 12 mg/dL (ref 6–20)
Calcium, Ion: 1.05 mmol/L — ABNORMAL LOW (ref 1.15–1.40)
Chloride: 109 mmol/L (ref 98–111)
Creatinine, Ser: 1.2 mg/dL — ABNORMAL HIGH (ref 0.44–1.00)
Glucose, Bld: 114 mg/dL — ABNORMAL HIGH (ref 70–99)
HCT: 39 % (ref 36.0–46.0)
Hemoglobin: 13.3 g/dL (ref 12.0–15.0)
Potassium: 4.4 mmol/L (ref 3.5–5.1)
Sodium: 140 mmol/L (ref 135–145)
TCO2: 23 mmol/L (ref 22–32)

## 2022-12-20 MED ORDER — SIMETHICONE 80 MG PO CHEW: 80.0000 mg | CHEWABLE_TABLET | Freq: Four times a day (QID) | ORAL | Status: DC | PRN

## 2022-12-20 MED ORDER — PROMETHAZINE HCL 25 MG PO TABS: 25.0000 mg | ORAL_TABLET | Freq: Four times a day (QID) | ORAL | Status: DC | PRN

## 2022-12-20 MED ORDER — OXCARBAZEPINE 300 MG PO TABS
300.0000 mg | ORAL_TABLET | Freq: Two times a day (BID) | ORAL | Status: DC
  Administered 2022-12-21 – 2022-12-25 (×10): 300 mg via ORAL
  Filled 2022-12-20 (×10): qty 1

## 2022-12-20 MED ORDER — LINACLOTIDE 145 MCG PO CAPS: 290.0000 ug | ORAL_CAPSULE | Freq: Every day | ORAL | Status: DC | PRN

## 2022-12-20 MED ORDER — MOMETASONE FURO-FORMOTEROL FUM 200-5 MCG/ACT IN AERO: 2.0000 | INHALATION_SPRAY | Freq: Two times a day (BID) | RESPIRATORY_TRACT | Status: DC

## 2022-12-20 MED ORDER — ONDANSETRON HCL 4 MG/2ML IJ SOLN
4.0000 mg | Freq: Once | INTRAMUSCULAR | Status: AC
  Administered 2022-12-20: 4 mg via INTRAVENOUS
  Filled 2022-12-20: qty 2

## 2022-12-20 MED ORDER — PREGABALIN 25 MG PO CAPS
25.0000 mg | ORAL_CAPSULE | Freq: Three times a day (TID) | ORAL | Status: DC
  Administered 2022-12-21 – 2022-12-25 (×13): 25 mg via ORAL
  Filled 2022-12-20 (×14): qty 1

## 2022-12-20 MED ORDER — ACETAMINOPHEN 325 MG PO TABS
650.0000 mg | ORAL_TABLET | Freq: Four times a day (QID) | ORAL | Status: DC
  Administered 2022-12-20 – 2022-12-25 (×19): 650 mg via ORAL
  Filled 2022-12-20 (×20): qty 2

## 2022-12-20 MED ORDER — METHOCARBAMOL 1000 MG/10ML IJ SOLN
500.0000 mg | Freq: Four times a day (QID) | INTRAVENOUS | Status: DC | PRN
  Administered 2022-12-22: 500 mg via INTRAVENOUS
  Filled 2022-12-20: qty 500

## 2022-12-20 MED ORDER — SERTRALINE HCL 50 MG PO TABS: 50.0000 mg | ORAL_TABLET | Freq: Every day | ORAL | Status: DC

## 2022-12-20 MED ORDER — MONTELUKAST SODIUM 10 MG PO TABS: 10.0000 mg | ORAL_TABLET | Freq: Every day | ORAL | Status: DC | PRN

## 2022-12-20 MED ORDER — LACTATED RINGERS IV SOLN: INTRAVENOUS | Status: DC

## 2022-12-20 MED ORDER — HYDROMORPHONE HCL 1 MG/ML IJ SOLN
1.0000 mg | INTRAMUSCULAR | Status: DC | PRN
  Administered 2022-12-20 – 2022-12-21 (×3): 1 mg via INTRAVENOUS
  Filled 2022-12-20 (×4): qty 1

## 2022-12-20 MED ORDER — BACLOFEN 10 MG PO TABS: 10.0000 mg | ORAL_TABLET | Freq: Three times a day (TID) | ORAL | Status: DC

## 2022-12-20 MED ORDER — DOCUSATE SODIUM 100 MG PO CAPS
100.0000 mg | ORAL_CAPSULE | Freq: Two times a day (BID) | ORAL | Status: DC
  Administered 2022-12-21 – 2022-12-25 (×9): 100 mg via ORAL
  Filled 2022-12-20 (×10): qty 1

## 2022-12-20 MED ORDER — GABAPENTIN 300 MG PO CAPS
300.0000 mg | ORAL_CAPSULE | Freq: Three times a day (TID) | ORAL | Status: DC
  Administered 2022-12-21 – 2022-12-25 (×12): 300 mg via ORAL
  Filled 2022-12-20 (×13): qty 1

## 2022-12-20 MED ORDER — FLUTICASONE FUROATE-VILANTEROL 100-25 MCG/ACT IN AEPB
1.0000 | INHALATION_SPRAY | Freq: Every day | RESPIRATORY_TRACT | Status: DC
  Administered 2022-12-21 – 2022-12-25 (×5): 1 via RESPIRATORY_TRACT
  Filled 2022-12-20: qty 28

## 2022-12-20 MED ORDER — OXYCODONE HCL 5 MG PO TABS
10.0000 mg | ORAL_TABLET | ORAL | Status: DC | PRN
  Administered 2022-12-20 – 2022-12-25 (×10): 10 mg via ORAL
  Filled 2022-12-20 (×11): qty 2

## 2022-12-20 MED ORDER — METOCLOPRAMIDE HCL 5 MG/ML IJ SOLN
10.0000 mg | Freq: Four times a day (QID) | INTRAMUSCULAR | Status: DC
  Administered 2022-12-20 – 2022-12-25 (×18): 10 mg via INTRAVENOUS
  Filled 2022-12-20 (×18): qty 2

## 2022-12-20 MED ORDER — ALBUTEROL SULFATE HFA 108 (90 BASE) MCG/ACT IN AERS: 1.0000 | INHALATION_SPRAY | RESPIRATORY_TRACT | Status: DC | PRN

## 2022-12-20 MED ORDER — ONDANSETRON HCL 4 MG/2ML IJ SOLN
4.0000 mg | Freq: Four times a day (QID) | INTRAMUSCULAR | Status: DC | PRN
  Administered 2022-12-21 – 2022-12-22 (×2): 4 mg via INTRAVENOUS
  Filled 2022-12-20 (×2): qty 2

## 2022-12-20 MED ORDER — UMECLIDINIUM BROMIDE 62.5 MCG/ACT IN AEPB
1.0000 | INHALATION_SPRAY | Freq: Every day | RESPIRATORY_TRACT | Status: DC
  Administered 2022-12-21 – 2022-12-25 (×5): 1 via RESPIRATORY_TRACT
  Filled 2022-12-20: qty 7

## 2022-12-20 MED ORDER — OXYCODONE-ACETAMINOPHEN 5-325 MG PO TABS
1.0000 | ORAL_TABLET | Freq: Once | ORAL | Status: AC
  Administered 2022-12-20: 1 via ORAL
  Filled 2022-12-20: qty 1

## 2022-12-20 MED ORDER — ALBUTEROL SULFATE (2.5 MG/3ML) 0.083% IN NEBU: 2.5000 mg | INHALATION_SOLUTION | Freq: Four times a day (QID) | RESPIRATORY_TRACT | Status: DC | PRN

## 2022-12-20 MED ORDER — IOHEXOL 350 MG/ML SOLN
75.0000 mL | Freq: Once | INTRAVENOUS | Status: AC | PRN
  Administered 2022-12-20: 75 mL via INTRAVENOUS

## 2022-12-20 MED ORDER — HYDROMORPHONE HCL 1 MG/ML IJ SOLN
1.0000 mg | Freq: Once | INTRAMUSCULAR | Status: AC
  Administered 2022-12-20: 1 mg via INTRAVENOUS
  Filled 2022-12-20: qty 1

## 2022-12-20 MED ORDER — SODIUM CHLORIDE 0.9 % IV BOLUS
1000.0000 mL | Freq: Once | INTRAVENOUS | Status: AC
  Administered 2022-12-20: 1000 mL via INTRAVENOUS

## 2022-12-20 MED ORDER — MORPHINE SULFATE (PF) 4 MG/ML IV SOLN
4.0000 mg | Freq: Once | INTRAVENOUS | Status: AC
  Administered 2022-12-20: 4 mg via INTRAVENOUS
  Filled 2022-12-20: qty 1

## 2022-12-20 MED ORDER — OXYCODONE HCL 5 MG PO TABS: 5.0000 mg | ORAL_TABLET | ORAL | Status: DC | PRN

## 2022-12-20 MED ORDER — PROCHLORPERAZINE EDISYLATE 10 MG/2ML IJ SOLN: 10.0000 mg | INTRAMUSCULAR | Status: DC | PRN

## 2022-12-20 MED ORDER — FLUTICASONE FUROATE-VILANTEROL 100-25 MCG/INH IN AEPB: 1.0000 | INHALATION_SPRAY | Freq: Every day | RESPIRATORY_TRACT | Status: DC
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
## 2022-12-20 NOTE — ED Notes (Signed)
Patient transported to CT scan . 
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
## 2022-12-21 ENCOUNTER — Inpatient Hospital Stay (HOSPITAL_COMMUNITY): Payer: 59

## 2022-12-21 HISTORY — PX: IR US GUIDE VASC ACCESS RIGHT: IMG2390

## 2022-12-21 HISTORY — PX: IR ANGIOGRAM VISCERAL SELECTIVE: IMG657

## 2022-12-21 HISTORY — PX: IR EMBO ART  VEN HEMORR LYMPH EXTRAV  INC GUIDE ROADMAPPING: IMG5450

## 2022-12-21 LAB — CBC
HCT: 35.6 % — ABNORMAL LOW (ref 36.0–46.0)
HCT: 35.3 % — ABNORMAL LOW (ref 36.0–46.0)
HCT: 30.7 % — ABNORMAL LOW (ref 36.0–46.0)
HCT: 33.6 % — ABNORMAL LOW (ref 36.0–46.0)
Hemoglobin: 12.2 g/dL (ref 12.0–15.0)
Hemoglobin: 11.8 g/dL — ABNORMAL LOW (ref 12.0–15.0)
Hemoglobin: 10.4 g/dL — ABNORMAL LOW (ref 12.0–15.0)
Hemoglobin: 11 g/dL — ABNORMAL LOW (ref 12.0–15.0)
MCH: 30.9 pg (ref 26.0–34.0)
MCH: 31 pg (ref 26.0–34.0)
MCH: 31.4 pg (ref 26.0–34.0)
MCH: 30.7 pg (ref 26.0–34.0)
MCHC: 34.3 g/dL (ref 30.0–36.0)
MCHC: 33.4 g/dL (ref 30.0–36.0)
MCHC: 33.9 g/dL (ref 30.0–36.0)
MCHC: 32.7 g/dL (ref 30.0–36.0)
MCV: 90.1 fL (ref 80.0–100.0)
MCV: 92.7 fL (ref 80.0–100.0)
MCV: 92.7 fL (ref 80.0–100.0)
MCV: 93.9 fL (ref 80.0–100.0)
Platelets: 239 10*3/uL (ref 150–400)
Platelets: 238 10*3/uL (ref 150–400)
Platelets: 229 10*3/uL (ref 150–400)
Platelets: 189 10*3/uL (ref 150–400)
RBC: 3.95 MIL/uL (ref 3.87–5.11)
RBC: 3.81 MIL/uL — ABNORMAL LOW (ref 3.87–5.11)
RBC: 3.31 MIL/uL — ABNORMAL LOW (ref 3.87–5.11)
RBC: 3.58 MIL/uL — ABNORMAL LOW (ref 3.87–5.11)
RDW: 12.5 % (ref 11.5–15.5)
RDW: 12.8 % (ref 11.5–15.5)
RDW: 12.8 % (ref 11.5–15.5)
RDW: 12.6 % (ref 11.5–15.5)
WBC: 9 10*3/uL (ref 4.0–10.5)
WBC: 9.1 10*3/uL (ref 4.0–10.5)
WBC: 13.1 10*3/uL — ABNORMAL HIGH (ref 4.0–10.5)
WBC: 11 10*3/uL — ABNORMAL HIGH (ref 4.0–10.5)
nRBC: 0 % (ref 0.0–0.2)
nRBC: 0 % (ref 0.0–0.2)
nRBC: 0 % (ref 0.0–0.2)
nRBC: 0 % (ref 0.0–0.2)

## 2022-12-21 LAB — HIV ANTIBODY (ROUTINE TESTING W REFLEX): HIV Screen 4th Generation wRfx: NONREACTIVE

## 2022-12-21 LAB — HEPATIC FUNCTION PANEL
ALT: 37 U/L (ref 0–44)
AST: 36 U/L (ref 15–41)
Albumin: 3.4 g/dL — ABNORMAL LOW (ref 3.5–5.0)
Alkaline Phosphatase: 47 U/L (ref 38–126)
Bilirubin, Direct: <0.1 mg/dL (ref 0.0–0.2)
Total Bilirubin: 0.6 mg/dL (ref 0.3–1.2)
Total Protein: 5.9 g/dL — ABNORMAL LOW (ref 6.5–8.1)

## 2022-12-21 LAB — BASIC METABOLIC PANEL
Anion gap: 9 (ref 5–15)
BUN: 13 mg/dL (ref 6–20)
CO2: 21 mmol/L — ABNORMAL LOW (ref 22–32)
Calcium: 8.6 mg/dL — ABNORMAL LOW (ref 8.9–10.3)
Chloride: 107 mmol/L (ref 98–111)
Creatinine, Ser: 1.34 mg/dL — ABNORMAL HIGH (ref 0.44–1.00)
GFR, Estimated: 47 mL/min — ABNORMAL LOW (ref >60)
Glucose, Bld: 139 mg/dL — ABNORMAL HIGH (ref 70–99)
Potassium: 5.2 mmol/L — ABNORMAL HIGH (ref 3.5–5.1)
Sodium: 137 mmol/L (ref 135–145)

## 2022-12-21 LAB — PROTIME-INR
INR: 1 (ref 0.8–1.2)
Prothrombin Time: 13.4 seconds (ref 11.4–15.2)

## 2022-12-21 MED ORDER — FENTANYL CITRATE (PF) 100 MCG/2ML IJ SOLN
INTRAMUSCULAR | Status: AC
  Filled 2022-12-21: qty 2

## 2022-12-21 MED ORDER — IOHEXOL 300 MG/ML  SOLN
100.0000 mL | Freq: Once | INTRAMUSCULAR | Status: AC | PRN
  Administered 2022-12-21: 44 mL via INTRA_ARTERIAL

## 2022-12-21 MED ORDER — ONDANSETRON HCL 4 MG/2ML IJ SOLN
INTRAMUSCULAR | Status: AC
  Filled 2022-12-21: qty 2

## 2022-12-21 MED ORDER — SERTRALINE HCL 25 MG PO TABS
25.0000 mg | ORAL_TABLET | Freq: Every day | ORAL | Status: DC
  Administered 2022-12-21 – 2022-12-25 (×5): 25 mg via ORAL
  Filled 2022-12-21 (×6): qty 1

## 2022-12-21 MED ORDER — VANCOMYCIN HCL IN DEXTROSE 1-5 GM/200ML-% IV SOLN
1000.0000 mg | INTRAVENOUS | Status: AC
  Administered 2022-12-21: 1000 mg via INTRAVENOUS
  Filled 2022-12-21 (×2): qty 200

## 2022-12-21 MED ORDER — FENTANYL CITRATE (PF) 100 MCG/2ML IJ SOLN
INTRAMUSCULAR | Status: AC | PRN
  Administered 2022-12-21: 25 ug via INTRAVENOUS

## 2022-12-21 MED ORDER — LIDOCAINE HCL 1 % IJ SOLN
INTRAMUSCULAR | Status: AC
  Administered 2022-12-21: 10 mL
  Filled 2022-12-21: qty 20

## 2022-12-21 MED ORDER — MIDAZOLAM HCL 2 MG/2ML IJ SOLN
INTRAMUSCULAR | Status: AC
  Filled 2022-12-21: qty 2

## 2022-12-21 MED ORDER — SODIUM CHLORIDE 0.9 % IV SOLN: INTRAVENOUS | Status: DC

## 2022-12-21 MED ORDER — MIDAZOLAM HCL 2 MG/2ML IJ SOLN
INTRAMUSCULAR | Status: AC | PRN
  Administered 2022-12-21: 1 mg via INTRAVENOUS

## 2022-12-21 MED ORDER — IOHEXOL 300 MG/ML  SOLN
150.0000 mL | Freq: Once | INTRAMUSCULAR | Status: AC | PRN
  Administered 2022-12-21: 60 mL via INTRA_ARTERIAL

## 2022-12-21 MED ORDER — ONDANSETRON HCL 4 MG/2ML IJ SOLN
INTRAMUSCULAR | Status: AC | PRN
  Administered 2022-12-21: 4 mg via INTRAVENOUS

## 2022-12-21 NOTE — Progress Notes (Signed)
Patient arrived on the unit from ED. Patient  alert and orientated. Vital signs stable. Patient orientated to the unit. No signs and symptoms of distress noted with this assessment.
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
## 2022-12-21 NOTE — Procedures (Signed)
Interventional Radiology Procedure Note  Procedure: Angiogram and embolization of the splenic artery.   Complications: None  Estimated Blood Loss: None  Recommendations: - Continue bedrest - Continue to trend H&H - Watch for signs of splenic infarct / post embolization syndrome   Signed,  Criselda Peaches, MD
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
## 2022-12-22 ENCOUNTER — Inpatient Hospital Stay (HOSPITAL_COMMUNITY): Payer: 59

## 2022-12-22 LAB — HEPATIC FUNCTION PANEL
ALT: 25 U/L (ref 0–44)
AST: 24 U/L (ref 15–41)
Albumin: 2.8 g/dL — ABNORMAL LOW (ref 3.5–5.0)
Alkaline Phosphatase: 41 U/L (ref 38–126)
Bilirubin, Direct: <0.1 mg/dL (ref 0.0–0.2)
Total Bilirubin: 0.7 mg/dL (ref 0.3–1.2)
Total Protein: 5.1 g/dL — ABNORMAL LOW (ref 6.5–8.1)

## 2022-12-22 LAB — CBC
HCT: 24.7 % — ABNORMAL LOW (ref 36.0–46.0)
HCT: 25.4 % — ABNORMAL LOW (ref 36.0–46.0)
Hemoglobin: 8.3 g/dL — ABNORMAL LOW (ref 12.0–15.0)
Hemoglobin: 8.1 g/dL — ABNORMAL LOW (ref 12.0–15.0)
MCH: 31.2 pg (ref 26.0–34.0)
MCH: 30.5 pg (ref 26.0–34.0)
MCHC: 33.6 g/dL (ref 30.0–36.0)
MCHC: 31.9 g/dL (ref 30.0–36.0)
MCV: 92.9 fL (ref 80.0–100.0)
MCV: 95.5 fL (ref 80.0–100.0)
Platelets: 175 10*3/uL (ref 150–400)
Platelets: 165 10*3/uL (ref 150–400)
RBC: 2.66 MIL/uL — ABNORMAL LOW (ref 3.87–5.11)
RBC: 2.66 MIL/uL — ABNORMAL LOW (ref 3.87–5.11)
RDW: 12.7 % (ref 11.5–15.5)
RDW: 12.6 % (ref 11.5–15.5)
WBC: 10.1 10*3/uL (ref 4.0–10.5)
WBC: 10.2 10*3/uL (ref 4.0–10.5)
nRBC: 0 % (ref 0.0–0.2)
nRBC: 0 % (ref 0.0–0.2)

## 2022-12-22 LAB — BASIC METABOLIC PANEL
Anion gap: 7 (ref 5–15)
BUN: 12 mg/dL (ref 6–20)
CO2: 23 mmol/L (ref 22–32)
Calcium: 7.9 mg/dL — ABNORMAL LOW (ref 8.9–10.3)
Chloride: 107 mmol/L (ref 98–111)
Creatinine, Ser: 1.08 mg/dL — ABNORMAL HIGH (ref 0.44–1.00)
GFR, Estimated: >60 mL/min (ref >60)
Glucose, Bld: 116 mg/dL — ABNORMAL HIGH (ref 70–99)
Potassium: 4.1 mmol/L (ref 3.5–5.1)
Sodium: 137 mmol/L (ref 135–145)

## 2022-12-22 MED ORDER — CHLORHEXIDINE GLUCONATE CLOTH 2 % EX PADS
6.0000 | MEDICATED_PAD | Freq: Every day | CUTANEOUS | Status: DC
  Administered 2022-12-23 – 2022-12-25 (×4): 6 via TOPICAL

## 2022-12-22 NOTE — Progress Notes (Signed)
Patient is unable to void. Bladder scan performed at about 0400, and a total of 430 ml of urine scanned. In and Out cath performed, and a total of 541ml of urine drained. Post In and Out cath bladder scan indicated 0 ml.
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
## 2022-12-22 NOTE — Progress Notes (Signed)
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 Please see Amion for pager number during day hours 7:00am-4:30pm or 7:00am -11:30am on weekends

## 2022-12-23 LAB — CBC
HCT: 21.6 % — ABNORMAL LOW (ref 36.0–46.0)
HCT: 24.3 % — ABNORMAL LOW (ref 36.0–46.0)
Hemoglobin: 7 g/dL — ABNORMAL LOW (ref 12.0–15.0)
Hemoglobin: 8.3 g/dL — ABNORMAL LOW (ref 12.0–15.0)
MCH: 30.7 pg (ref 26.0–34.0)
MCH: 31.2 pg (ref 26.0–34.0)
MCHC: 32.4 g/dL (ref 30.0–36.0)
MCHC: 34.2 g/dL (ref 30.0–36.0)
MCV: 94.7 fL (ref 80.0–100.0)
MCV: 91.4 fL (ref 80.0–100.0)
Platelets: 158 10*3/uL (ref 150–400)
Platelets: 162 10*3/uL (ref 150–400)
RBC: 2.28 MIL/uL — ABNORMAL LOW (ref 3.87–5.11)
RBC: 2.66 MIL/uL — ABNORMAL LOW (ref 3.87–5.11)
RDW: 12.6 % (ref 11.5–15.5)
RDW: 12.6 % (ref 11.5–15.5)
WBC: 8.9 10*3/uL (ref 4.0–10.5)
WBC: 9.4 10*3/uL (ref 4.0–10.5)
nRBC: 0 % (ref 0.0–0.2)
nRBC: 0 % (ref 0.0–0.2)

## 2022-12-23 LAB — PREPARE RBC (CROSSMATCH)

## 2022-12-23 LAB — BASIC METABOLIC PANEL
Anion gap: 6 (ref 5–15)
BUN: 12 mg/dL (ref 6–20)
CO2: 25 mmol/L (ref 22–32)
Calcium: 7.9 mg/dL — ABNORMAL LOW (ref 8.9–10.3)
Chloride: 103 mmol/L (ref 98–111)
Creatinine, Ser: 1.02 mg/dL — ABNORMAL HIGH (ref 0.44–1.00)
GFR, Estimated: >60 mL/min (ref >60)
Glucose, Bld: 91 mg/dL (ref 70–99)
Potassium: 4.1 mmol/L (ref 3.5–5.1)
Sodium: 134 mmol/L — ABNORMAL LOW (ref 135–145)

## 2022-12-23 MED ORDER — SODIUM CHLORIDE 0.9% IV SOLUTION: Freq: Once | INTRAVENOUS | Status: DC

## 2022-12-23 MED ORDER — SODIUM CHLORIDE 0.9% IV SOLUTION: Freq: Once | INTRAVENOUS | Status: AC

## 2022-12-23 MED ORDER — TAMSULOSIN HCL 0.4 MG PO CAPS
0.4000 mg | ORAL_CAPSULE | Freq: Every day | ORAL | Status: DC
  Administered 2022-12-23 – 2022-12-25 (×3): 0.4 mg via ORAL
  Filled 2022-12-23 (×3): qty 1

## 2022-12-23 NOTE — Care Management Important Message (Signed)
Important Message  Patient Details  Name: Bridget [name] MRN: [id number] Date of Birth: [birth year]   Medicare Important Message Given:  Yes     Alesya Castellani 12/23/2022, 11:44 AM
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
## 2022-12-23 NOTE — Progress Notes (Signed)
PT Cancellation Note  Patient Details Name: Bridget [name] MRN: [id number] DOB: 04-Jan-1966   Cancelled Treatment:    Reason Eval/Treat Not Completed: Medical issues which prohibited therapy  Initiated evaluation and pt hesitant to move due to incr abd pain and had already been up to chair with OT. As we began to work towards sitting on EOB, +blood escaping from IV site where pt receiving blood. Pt returned to supine and RN in to re-dress IV. Will reattempt 12/24/22. Based on how pt did with OT do not expect she will have any follow-up PT or DME needs.    [place]  Office [phone]  Rexanne Mano 12/23/2022, 3:03 PM

## 2022-12-23 NOTE — Progress Notes (Signed)
PT Cancellation Note  Patient Details Name: Bridget [name] MRN: [id number] DOB: [birth year]   Cancelled Treatment:    Reason Eval/Treat Not Completed: Patient at procedure or test/unavailable  RN beginning a blood transfusion. Will see later this pm as schedule permits.   [place]  Office [phone]  Rexanne Mano 12/23/2022, 1:41 PM

## 2022-12-23 NOTE — TOC CAGE-AID Note (Signed)
Transition of Care [facility]) - CAGE-AID Screening   Patient Details  Name: Bridget [name] MRN: [id number] Date of Birth: 07/28/1966  Transition of Care [facility]) CM/SW Contact:    Trudee Kuster, RN Phone Number: 12/23/2022, 1:55 AM   Clinical Narrative: Denies alcohol use, denies illicit drug use. Education not offered at this time.   CAGE-AID Screening:    Have You Ever Felt You Ought to Cut Down on Your Drinking or Drug Use?: No Have People Annoyed You By Critizing Your Drinking Or Drug Use?: No Have You Felt Bad Or Guilty About Your Drinking Or Drug Use?: No Have You Ever Had a Drink or Used Drugs First Thing In The Morning to Steady Your Nerves or to Get Rid of a Hangover?: No CAGE-AID Score: 0  Substance Abuse Education Offered: No
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
## 2022-12-24 LAB — BPAM RBC
Blood Product Expiration Date: 202404132359
ISSUE DATE / TIME: 202403181320
Unit Type and Rh: 5100

## 2022-12-24 LAB — CBC
HCT: 24 % — ABNORMAL LOW (ref 36.0–46.0)
Hemoglobin: 8.2 g/dL — ABNORMAL LOW (ref 12.0–15.0)
MCH: 31.2 pg (ref 26.0–34.0)
MCHC: 34.2 g/dL (ref 30.0–36.0)
MCV: 91.3 fL (ref 80.0–100.0)
Platelets: 175 10*3/uL (ref 150–400)
RBC: 2.63 MIL/uL — ABNORMAL LOW (ref 3.87–5.11)
RDW: 12.7 % (ref 11.5–15.5)
WBC: 8.3 10*3/uL (ref 4.0–10.5)
nRBC: 0 % (ref 0.0–0.2)

## 2022-12-24 LAB — TYPE AND SCREEN
ABO/RH(D): O POS
Antibody Screen: NEGATIVE
Unit division: 0

## 2022-12-24 LAB — BASIC METABOLIC PANEL
Anion gap: 7 (ref 5–15)
BUN: 10 mg/dL (ref 6–20)
CO2: 26 mmol/L (ref 22–32)
Calcium: 8 mg/dL — ABNORMAL LOW (ref 8.9–10.3)
Chloride: 103 mmol/L (ref 98–111)
Creatinine, Ser: 0.88 mg/dL (ref 0.44–1.00)
GFR, Estimated: >60 mL/min (ref >60)
Glucose, Bld: 100 mg/dL — ABNORMAL HIGH (ref 70–99)
Potassium: 3.9 mmol/L (ref 3.5–5.1)
Sodium: 136 mmol/L (ref 135–145)

## 2022-12-24 LAB — VITAMIN B12: Vitamin B-12: 150 pg/mL — ABNORMAL LOW (ref 180–914)

## 2022-12-24 LAB — TSH: TSH: <0.01 u[IU]/mL — ABNORMAL LOW (ref 0.350–4.500)

## 2022-12-24 LAB — FOLATE: Folate: 9.4 ng/mL (ref >5.9)

## 2022-12-24 MED ORDER — METHOCARBAMOL 500 MG PO TABS
500.0000 mg | ORAL_TABLET | Freq: Three times a day (TID) | ORAL | Status: DC
  Administered 2022-12-24 – 2022-12-25 (×4): 500 mg via ORAL
  Filled 2022-12-24 (×4): qty 1

## 2022-12-24 MED ORDER — AMITRIPTYLINE HCL 50 MG PO TABS
100.0000 mg | ORAL_TABLET | Freq: Every day | ORAL | Status: DC
  Administered 2022-12-24: 100 mg via ORAL
  Filled 2022-12-24: qty 2
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
## 2022-12-25 DIAGNOSIS — S3609XA Other injury of spleen, initial encounter: Secondary | ICD-10-CM

## 2022-12-25 DIAGNOSIS — F430 Acute stress reaction: Secondary | ICD-10-CM

## 2022-12-25 LAB — CBC
HCT: 24.9 % — ABNORMAL LOW (ref 36.0–46.0)
Hemoglobin: 8.2 g/dL — ABNORMAL LOW (ref 12.0–15.0)
MCH: 30.7 pg (ref 26.0–34.0)
MCHC: 32.9 g/dL (ref 30.0–36.0)
MCV: 93.3 fL (ref 80.0–100.0)
Platelets: 218 10*3/uL (ref 150–400)
RBC: 2.67 MIL/uL — ABNORMAL LOW (ref 3.87–5.11)
RDW: 12.9 % (ref 11.5–15.5)
WBC: 8 10*3/uL (ref 4.0–10.5)
nRBC: 0 % (ref 0.0–0.2)

## 2022-12-25 MED ORDER — ACETAMINOPHEN 325 MG PO TABS: 650.0000 mg | ORAL_TABLET | Freq: Four times a day (QID) | ORAL | Status: AC | PRN

## 2022-12-25 MED ORDER — OXYCODONE HCL 5 MG PO TABS: 5.0000 mg | ORAL_TABLET | ORAL | 0 refills | Status: AC | PRN

## 2022-12-25 MED ORDER — METHOCARBAMOL 500 MG PO TABS: 500.0000 mg | ORAL_TABLET | Freq: Three times a day (TID) | ORAL | 0 refills | Status: DC | PRN
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
## 2022-12-25 NOTE — Plan of Care (Signed)
Discharged to home

## 2022-12-25 NOTE — TOC Transition Note (Signed)
Transition of Care [facility]) - CM/SW Discharge Note   Patient Details  Name: Bridget [name] MRN: [id number] Date of Birth: [birth year]  Transition of [facility] CM/SW Contact:  Ella Bodo, RN Phone Number: 12/25/2022, 10:55am  Clinical Narrative:    Patient medically stable for discharge home with family to provide needed assistance. PT recommending CIR, but patient declines evaluation.  She is agreeable to arrangement of HHPT; referral to [facility] for continued PT.  No other dc needs identified.    Final next level of care: Home w Home Health Services Barriers to Discharge: Barriers Resolved   Patient Goals and CMS Choice CMS Medicare.gov Compare Post Acute Care list provided to:: Patient Choice offered to / list presented to : Patient                      Discharge Plan and Services Additional resources added to the After Visit Summary for     Discharge Planning Services: CM Consult Post Acute Care Choice: Home Health                    HH Arranged: PT [place]: [place] Date [place]: 12/25/22   Representative spoke with at [place]: [place] Determinants of Health (SDOH) Interventions SDOH Screenings   Depression (PHQ2-9): Medium Risk (07/10/2021)  Tobacco Use: High Risk (12/21/2022)     Readmission Risk Interventions     No data to display         Reinaldo Raddle, RN, BSN  Trauma/Neuro ICU Case Manager [phone]

## 2022-12-25 NOTE — Progress Notes (Signed)
Inpatient Rehab Admissions Coordinator:   Per PT recommendation, patient was screened for CIR candidacy by Clemens Catholic, MS, CCC-SLP. At this time, Pt. does not appear to demonstrate medical necessity to justify in hospital rehabilitation/CIR.I also do not think payor will approve CIR for this dx. Additionally, when pt's pain was better managed, OT was recommending no follow up at d/c, so I doubt she requires intensive rehab to return to baseline. I  will not pursue a rehab consult for this Pt.   Recommend other rehab venues to be pursued.  Please contact me with any questions.  Clemens Catholic, [place], [place] Admissions Coordinator  [phone] ([place]) [phone] (office)
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
## 2022-12-31 ENCOUNTER — Emergency Department (HOSPITAL_COMMUNITY): Payer: 59

## 2022-12-31 ENCOUNTER — Encounter (HOSPITAL_COMMUNITY): Payer: Self-pay | Admitting: Emergency Medicine

## 2022-12-31 ENCOUNTER — Other Ambulatory Visit: Payer: Self-pay

## 2022-12-31 ENCOUNTER — Inpatient Hospital Stay (HOSPITAL_COMMUNITY)
Admission: EM | Admit: 2022-12-31 | Discharge: 2023-01-09 | DRG: 800 | Disposition: A | Discharge status: Skilled Nursing Facility | Payer: 59 | Attending: General Surgery | Admitting: General Surgery

## 2022-12-31 DIAGNOSIS — F329 Major depressive disorder, single episode, unspecified: Secondary | ICD-10-CM | POA: Diagnosis present

## 2022-12-31 DIAGNOSIS — Z79899 Other long term (current) drug therapy: Secondary | ICD-10-CM

## 2022-12-31 DIAGNOSIS — S36031A Moderate laceration of spleen, initial encounter: Secondary | ICD-10-CM | POA: Diagnosis present

## 2022-12-31 DIAGNOSIS — F17210 Nicotine dependence, cigarettes, uncomplicated: Secondary | ICD-10-CM | POA: Diagnosis present

## 2022-12-31 DIAGNOSIS — Z9049 Acquired absence of other specified parts of digestive tract: Secondary | ICD-10-CM

## 2022-12-31 DIAGNOSIS — W19XXXA Unspecified fall, initial encounter: Secondary | ICD-10-CM | POA: Diagnosis not present

## 2022-12-31 DIAGNOSIS — Z7951 Long term (current) use of inhaled steroids: Secondary | ICD-10-CM

## 2022-12-31 DIAGNOSIS — D75839 Thrombocytosis, unspecified: Secondary | ICD-10-CM | POA: Diagnosis present

## 2022-12-31 DIAGNOSIS — I10 Essential (primary) hypertension: Secondary | ICD-10-CM | POA: Diagnosis not present

## 2022-12-31 DIAGNOSIS — Y92009 Unspecified place in unspecified non-institutional (private) residence as the place of occurrence of the external cause: Secondary | ICD-10-CM

## 2022-12-31 DIAGNOSIS — S36029A Unspecified contusion of spleen, initial encounter: Secondary | ICD-10-CM | POA: Diagnosis not present

## 2022-12-31 DIAGNOSIS — Y92410 Unspecified street and highway as the place of occurrence of the external cause: Secondary | ICD-10-CM | POA: Diagnosis not present

## 2022-12-31 DIAGNOSIS — G479 Sleep disorder, unspecified: Secondary | ICD-10-CM | POA: Diagnosis present

## 2022-12-31 DIAGNOSIS — F4323 Adjustment disorder with mixed anxiety and depressed mood: Secondary | ICD-10-CM

## 2022-12-31 DIAGNOSIS — G629 Polyneuropathy, unspecified: Secondary | ICD-10-CM | POA: Diagnosis present

## 2022-12-31 DIAGNOSIS — Z8051 Family history of malignant neoplasm of kidney: Secondary | ICD-10-CM

## 2022-12-31 DIAGNOSIS — Z888 Allergy status to other drugs, medicaments and biological substances status: Secondary | ICD-10-CM | POA: Diagnosis not present

## 2022-12-31 DIAGNOSIS — R112 Nausea with vomiting, unspecified: Secondary | ICD-10-CM | POA: Diagnosis not present

## 2022-12-31 DIAGNOSIS — R109 Unspecified abdominal pain: Secondary | ICD-10-CM

## 2022-12-31 DIAGNOSIS — Z91013 Allergy to seafood: Secondary | ICD-10-CM | POA: Diagnosis not present

## 2022-12-31 DIAGNOSIS — S3600XD Unspecified injury of spleen, subsequent encounter: Principal | ICD-10-CM

## 2022-12-31 DIAGNOSIS — G373 Acute transverse myelitis in demyelinating disease of central nervous system: Secondary | ICD-10-CM | POA: Diagnosis present

## 2022-12-31 DIAGNOSIS — S3609XA Other injury of spleen, initial encounter: Secondary | ICD-10-CM | POA: Diagnosis not present

## 2022-12-31 DIAGNOSIS — Z9181 History of falling: Secondary | ICD-10-CM

## 2022-12-31 DIAGNOSIS — Z23 Encounter for immunization: Secondary | ICD-10-CM | POA: Diagnosis present

## 2022-12-31 DIAGNOSIS — M1990 Unspecified osteoarthritis, unspecified site: Secondary | ICD-10-CM | POA: Diagnosis present

## 2022-12-31 DIAGNOSIS — F430 Acute stress reaction: Secondary | ICD-10-CM | POA: Diagnosis present

## 2022-12-31 DIAGNOSIS — Z981 Arthrodesis status: Secondary | ICD-10-CM

## 2022-12-31 DIAGNOSIS — Z560 Unemployment, unspecified: Secondary | ICD-10-CM

## 2022-12-31 DIAGNOSIS — J45909 Unspecified asthma, uncomplicated: Secondary | ICD-10-CM | POA: Diagnosis not present

## 2022-12-31 DIAGNOSIS — D62 Acute posthemorrhagic anemia: Secondary | ICD-10-CM | POA: Diagnosis present

## 2022-12-31 DIAGNOSIS — Z880 Allergy status to penicillin: Secondary | ICD-10-CM

## 2022-12-31 DIAGNOSIS — Z90710 Acquired absence of both cervix and uterus: Secondary | ICD-10-CM

## 2022-12-31 DIAGNOSIS — G8929 Other chronic pain: Secondary | ICD-10-CM | POA: Diagnosis present

## 2022-12-31 LAB — COMPREHENSIVE METABOLIC PANEL
ALT: 43 U/L (ref 0–44)
AST: 40 U/L (ref 15–41)
Albumin: 3.4 g/dL — ABNORMAL LOW (ref 3.5–5.0)
Alkaline Phosphatase: 62 U/L (ref 38–126)
Anion gap: 13 (ref 5–15)
BUN: 11 mg/dL (ref 6–20)
CO2: 21 mmol/L — ABNORMAL LOW (ref 22–32)
Calcium: 9 mg/dL (ref 8.9–10.3)
Chloride: 104 mmol/L (ref 98–111)
Creatinine, Ser: 0.85 mg/dL (ref 0.44–1.00)
GFR, Estimated: >60 mL/min (ref >60)
Glucose, Bld: 99 mg/dL (ref 70–99)
Potassium: 3.9 mmol/L (ref 3.5–5.1)
Sodium: 138 mmol/L (ref 135–145)
Total Bilirubin: 0.6 mg/dL (ref 0.3–1.2)
Total Protein: 7.2 g/dL (ref 6.5–8.1)

## 2022-12-31 LAB — CBC WITH DIFFERENTIAL/PLATELET
Abs Immature Granulocytes: 0.04 10*3/uL (ref 0.00–0.07)
Basophils Absolute: 0.1 10*3/uL (ref 0.0–0.1)
Basophils Relative: 1 %
Eosinophils Absolute: 0.2 10*3/uL (ref 0.0–0.5)
Eosinophils Relative: 2 %
HCT: 33.9 % — ABNORMAL LOW (ref 36.0–46.0)
Hemoglobin: 10.8 g/dL — ABNORMAL LOW (ref 12.0–15.0)
Immature Granulocytes: 0 %
Lymphocytes Relative: 21 %
Lymphs Abs: 2 10*3/uL (ref 0.7–4.0)
MCH: 30.5 pg (ref 26.0–34.0)
MCHC: 31.9 g/dL (ref 30.0–36.0)
MCV: 95.8 fL (ref 80.0–100.0)
Monocytes Absolute: 0.7 10*3/uL (ref 0.1–1.0)
Monocytes Relative: 7 %
Neutro Abs: 6.6 10*3/uL (ref 1.7–7.7)
Neutrophils Relative %: 69 %
Platelets: 605 10*3/uL — ABNORMAL HIGH (ref 150–400)
RBC: 3.54 MIL/uL — ABNORMAL LOW (ref 3.87–5.11)
RDW: 13.5 % (ref 11.5–15.5)
WBC: 9.5 10*3/uL (ref 4.0–10.5)
nRBC: 0 % (ref 0.0–0.2)

## 2022-12-31 MED ORDER — PROMETHAZINE HCL 25 MG PO TABS
25.0000 mg | ORAL_TABLET | Freq: Once | ORAL | Status: AC
  Administered 2022-12-31: 25 mg via ORAL
  Filled 2022-12-31: qty 1

## 2022-12-31 MED ORDER — TIZANIDINE HCL 4 MG PO TABS
2.0000 mg | ORAL_TABLET | Freq: Once | ORAL | Status: AC
  Administered 2022-12-31: 2 mg via ORAL
  Filled 2022-12-31: qty 1

## 2022-12-31 MED ORDER — IOHEXOL 350 MG/ML SOLN
75.0000 mL | Freq: Once | INTRAVENOUS | Status: AC | PRN
  Administered 2022-12-31: 75 mL via INTRAVENOUS

## 2022-12-31 MED ORDER — MORPHINE SULFATE (PF) 4 MG/ML IV SOLN
4.0000 mg | Freq: Once | INTRAVENOUS | Status: AC
  Administered 2022-12-31: 4 mg via INTRAVENOUS
  Filled 2022-12-31: qty 1
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
## 2022-12-31 NOTE — ED Notes (Signed)
Pt medicated, states pain is easing. Toileting offered. No other acute distress noted at this time. Call bell in reach.

## 2022-12-31 NOTE — ED Notes (Signed)
Pt to CT
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
## 2023-01-01 ENCOUNTER — Inpatient Hospital Stay (HOSPITAL_COMMUNITY): Payer: 59 | Admitting: Anesthesiology

## 2023-01-01 ENCOUNTER — Encounter (HOSPITAL_COMMUNITY): Payer: Self-pay | Admitting: General Surgery

## 2023-01-01 ENCOUNTER — Other Ambulatory Visit: Payer: Self-pay

## 2023-01-01 ENCOUNTER — Encounter (HOSPITAL_COMMUNITY): Admission: EM | Disposition: A | Discharge status: Skilled Nursing Facility | Payer: Self-pay | Source: Home / Self Care

## 2023-01-01 DIAGNOSIS — J45909 Unspecified asthma, uncomplicated: Secondary | ICD-10-CM

## 2023-01-01 DIAGNOSIS — S3609XA Other injury of spleen, initial encounter: Secondary | ICD-10-CM

## 2023-01-01 DIAGNOSIS — I10 Essential (primary) hypertension: Secondary | ICD-10-CM

## 2023-01-01 DIAGNOSIS — F17210 Nicotine dependence, cigarettes, uncomplicated: Secondary | ICD-10-CM

## 2023-01-01 HISTORY — PX: SPLENECTOMY, TOTAL: SHX788

## 2023-01-01 LAB — CBC
HCT: 32.2 % — ABNORMAL LOW (ref 36.0–46.0)
Hemoglobin: 10.3 g/dL — ABNORMAL LOW (ref 12.0–15.0)
MCH: 30.3 pg (ref 26.0–34.0)
MCHC: 32 g/dL (ref 30.0–36.0)
MCV: 94.7 fL (ref 80.0–100.0)
Platelets: 568 10*3/uL — ABNORMAL HIGH (ref 150–400)
RBC: 3.4 MIL/uL — ABNORMAL LOW (ref 3.87–5.11)
RDW: 13.6 % (ref 11.5–15.5)
WBC: 7.3 10*3/uL (ref 4.0–10.5)
nRBC: 0 % (ref 0.0–0.2)

## 2023-01-01 LAB — BASIC METABOLIC PANEL
Anion gap: 12 (ref 5–15)
BUN: 11 mg/dL (ref 6–20)
CO2: 23 mmol/L (ref 22–32)
Calcium: 8.6 mg/dL — ABNORMAL LOW (ref 8.9–10.3)
Chloride: 101 mmol/L (ref 98–111)
Creatinine, Ser: 0.86 mg/dL (ref 0.44–1.00)
GFR, Estimated: >60 mL/min (ref >60)
Glucose, Bld: 96 mg/dL (ref 70–99)
Potassium: 3.8 mmol/L (ref 3.5–5.1)
Sodium: 136 mmol/L (ref 135–145)

## 2023-01-01 LAB — POCT I-STAT 7, (LYTES, BLD GAS, ICA,H+H)
Acid-base deficit: 3 mmol/L — ABNORMAL HIGH (ref 0.0–2.0)
Bicarbonate: 22 mmol/L (ref 20.0–28.0)
Calcium, Ion: 1.19 mmol/L (ref 1.15–1.40)
HCT: 25 % — ABNORMAL LOW (ref 36.0–46.0)
Hemoglobin: 8.5 g/dL — ABNORMAL LOW (ref 12.0–15.0)
O2 Saturation: 100 %
Potassium: 4.2 mmol/L (ref 3.5–5.1)
Sodium: 136 mmol/L (ref 135–145)
TCO2: 23 mmol/L (ref 22–32)
pCO2 arterial: 36.7 mmHg (ref 32–48)
pH, Arterial: 7.386 (ref 7.35–7.45)
pO2, Arterial: 225 mmHg — ABNORMAL HIGH (ref 83–108)

## 2023-01-01 LAB — HEMOGLOBIN AND HEMATOCRIT, BLOOD
HCT: 33.4 % — ABNORMAL LOW (ref 36.0–46.0)
Hemoglobin: 11.5 g/dL — ABNORMAL LOW (ref 12.0–15.0)

## 2023-01-01 LAB — PREPARE RBC (CROSSMATCH)

## 2023-01-01 LAB — MRSA NEXT GEN BY PCR, NASAL: MRSA by PCR Next Gen: NOT DETECTED

## 2023-01-01 SURGERY — SPLENECTOMY
Anesthesia: General

## 2023-01-01 MED ORDER — FENTANYL CITRATE (PF) 250 MCG/5ML IJ SOLN
INTRAMUSCULAR | Status: AC
  Filled 2023-01-01: qty 5

## 2023-01-01 MED ORDER — LACTATED RINGERS IV SOLN: INTRAVENOUS | Status: DC

## 2023-01-01 MED ORDER — LACTATED RINGERS IV SOLN: INTRAVENOUS | Status: DC | PRN

## 2023-01-01 MED ORDER — FENTANYL CITRATE (PF) 100 MCG/2ML IJ SOLN: 25.0000 ug | INTRAMUSCULAR | Status: DC | PRN

## 2023-01-01 MED ORDER — ONDANSETRON 4 MG PO TBDP
4.0000 mg | ORAL_TABLET | Freq: Four times a day (QID) | ORAL | Status: DC | PRN
  Filled 2023-01-01: qty 1

## 2023-01-01 MED ORDER — LIDOCAINE 2% (20 MG/ML) 5 ML SYRINGE
INTRAMUSCULAR | Status: DC | PRN
  Administered 2023-01-01: 100 mg via INTRAVENOUS

## 2023-01-01 MED ORDER — THROMBIN 20000 UNITS EX SOLR
CUTANEOUS | Status: AC
  Filled 2023-01-01: qty 20000

## 2023-01-01 MED ORDER — CHLORHEXIDINE GLUCONATE 0.12 % MT SOLN
OROMUCOSAL | Status: AC
  Administered 2023-01-01: 15 mL via OROMUCOSAL
  Filled 2023-01-01: qty 15

## 2023-01-01 MED ORDER — PROMETHAZINE HCL 25 MG/ML IJ SOLN: 6.2500 mg | INTRAMUSCULAR | Status: DC | PRN

## 2023-01-01 MED ORDER — FENTANYL CITRATE (PF) 100 MCG/2ML IJ SOLN
INTRAMUSCULAR | Status: AC
  Filled 2023-01-01: qty 2

## 2023-01-01 MED ORDER — PANTOPRAZOLE SODIUM 40 MG IV SOLR: 40.0000 mg | Freq: Every day | INTRAVENOUS | Status: DC

## 2023-01-01 MED ORDER — DEXMEDETOMIDINE HCL IN NACL 80 MCG/20ML IV SOLN
INTRAVENOUS | Status: AC
  Filled 2023-01-01: qty 20

## 2023-01-01 MED ORDER — DEXAMETHASONE SODIUM PHOSPHATE 10 MG/ML IJ SOLN
INTRAMUSCULAR | Status: DC | PRN
  Administered 2023-01-01: 8 mg via INTRAVENOUS

## 2023-01-01 MED ORDER — HYDROMORPHONE HCL 1 MG/ML IJ SOLN
INTRAMUSCULAR | Status: DC | PRN
  Administered 2023-01-01 (×2): .5 mg via INTRAVENOUS

## 2023-01-01 MED ORDER — MORPHINE SULFATE (PF) 4 MG/ML IV SOLN
4.0000 mg | INTRAVENOUS | Status: DC | PRN
  Administered 2023-01-01 – 2023-01-03 (×11): 4 mg via INTRAVENOUS
  Filled 2023-01-01 (×11): qty 1

## 2023-01-01 MED ORDER — SUGAMMADEX SODIUM 200 MG/2ML IV SOLN
INTRAVENOUS | Status: DC | PRN
  Administered 2023-01-01: 200 mg via INTRAVENOUS

## 2023-01-01 MED ORDER — ALBUMIN HUMAN 5 % IV SOLN: INTRAVENOUS | Status: DC | PRN

## 2023-01-01 MED ORDER — VANCOMYCIN HCL IN DEXTROSE 1-5 GM/200ML-% IV SOLN
INTRAVENOUS | Status: AC
  Filled 2023-01-01: qty 200

## 2023-01-01 MED ORDER — FENTANYL CITRATE (PF) 100 MCG/2ML IJ SOLN
25.0000 ug | INTRAMUSCULAR | Status: DC | PRN
  Administered 2023-01-01 (×2): 50 ug via INTRAVENOUS

## 2023-01-01 MED ORDER — MIDAZOLAM HCL 2 MG/2ML IJ SOLN
INTRAMUSCULAR | Status: AC
  Filled 2023-01-01: qty 2

## 2023-01-01 MED ORDER — FENTANYL CITRATE (PF) 250 MCG/5ML IJ SOLN
INTRAMUSCULAR | Status: DC | PRN
  Administered 2023-01-01: 50 ug via INTRAVENOUS
  Administered 2023-01-01: 150 ug via INTRAVENOUS
  Administered 2023-01-01: 50 ug via INTRAVENOUS

## 2023-01-01 MED ORDER — ACETAMINOPHEN 325 MG PO TABS
650.0000 mg | ORAL_TABLET | ORAL | Status: DC | PRN
  Administered 2023-01-01 – 2023-01-03 (×4): 650 mg via ORAL
  Filled 2023-01-01 (×4): qty 2

## 2023-01-01 MED ORDER — PROPOFOL 10 MG/ML IV BOLUS
INTRAVENOUS | Status: DC | PRN
  Administered 2023-01-01: 100 mg via INTRAVENOUS

## 2023-01-01 MED ORDER — HYDROMORPHONE HCL 1 MG/ML IJ SOLN
INTRAMUSCULAR | Status: AC
  Filled 2023-01-01: qty 0.5

## 2023-01-01 MED ORDER — PANTOPRAZOLE SODIUM 40 MG PO TBEC
40.0000 mg | DELAYED_RELEASE_TABLET | Freq: Every day | ORAL | Status: DC
  Administered 2023-01-01 – 2023-01-09 (×9): 40 mg via ORAL
  Filled 2023-01-01 (×9): qty 1

## 2023-01-01 MED ORDER — ONDANSETRON HCL 4 MG/2ML IJ SOLN
INTRAMUSCULAR | Status: DC | PRN
  Administered 2023-01-01: 4 mg via INTRAVENOUS

## 2023-01-01 MED ORDER — ONDANSETRON HCL 4 MG/2ML IJ SOLN
4.0000 mg | Freq: Four times a day (QID) | INTRAMUSCULAR | Status: DC | PRN
  Administered 2023-01-01 – 2023-01-04 (×5): 4 mg via INTRAVENOUS
  Filled 2023-01-01 (×5): qty 2

## 2023-01-01 MED ORDER — OXYCODONE HCL 5 MG PO TABS
5.0000 mg | ORAL_TABLET | ORAL | Status: DC | PRN
  Administered 2023-01-01 – 2023-01-03 (×8): 5 mg via ORAL
  Filled 2023-01-01 (×8): qty 1

## 2023-01-01 MED ORDER — PROPOFOL 10 MG/ML IV BOLUS
INTRAVENOUS | Status: AC
  Filled 2023-01-01: qty 20

## 2023-01-01 MED ORDER — AMISULPRIDE (ANTIEMETIC) 5 MG/2ML IV SOLN: 10.0000 mg | Freq: Once | INTRAVENOUS | Status: DC | PRN

## 2023-01-01 MED ORDER — 0.9 % SODIUM CHLORIDE (POUR BTL) OPTIME
TOPICAL | Status: DC | PRN
  Administered 2023-01-01: 4000 mL

## 2023-01-01 MED ORDER — CHLORHEXIDINE GLUCONATE 0.12 % MT SOLN
15.0000 mL | OROMUCOSAL | Status: AC
  Filled 2023-01-01: qty 15

## 2023-01-01 MED ORDER — ROCURONIUM BROMIDE 10 MG/ML (PF) SYRINGE
PREFILLED_SYRINGE | INTRAVENOUS | Status: DC | PRN
  Administered 2023-01-01: 20 mg via INTRAVENOUS
  Administered 2023-01-01: 80 mg via INTRAVENOUS

## 2023-01-01 MED ORDER — VANCOMYCIN HCL 1000 MG IV SOLR
INTRAVENOUS | Status: DC | PRN
  Administered 2023-01-01: 1000 mg via INTRAVENOUS

## 2023-01-01 MED ORDER — PHENYLEPHRINE HCL-NACL 20-0.9 MG/250ML-% IV SOLN
INTRAVENOUS | Status: DC | PRN
  Administered 2023-01-01: 25 ug/min via INTRAVENOUS

## 2023-01-01 MED ORDER — DEXMEDETOMIDINE HCL IN NACL 80 MCG/20ML IV SOLN
INTRAVENOUS | Status: DC | PRN
  Administered 2023-01-01: 8 ug via BUCCAL

## 2023-01-01 MED ORDER — SODIUM CHLORIDE 0.9 % IV SOLN: 10.0000 mL/h | Freq: Once | INTRAVENOUS | Status: DC

## 2023-01-01 SURGICAL SUPPLY — 55 items
APL PRP STRL LF DISP 70% ISPRP (MISCELLANEOUS) ×1
APPLIER CLIP 11 MED OPEN (CLIP) ×1
APPLIER CLIP 13 LRG OPEN (CLIP)
APR CLP LRG 13 20 CLIP (CLIP)
APR CLP MED 11 20 MLT OPN (CLIP) ×1
BAG COUNTER SPONGE SURGICOUNT (BAG) ×2 IMPLANT
BAG SPNG CNTER NS LX DISP (BAG) ×1
BLADE CLIPPER SURG (BLADE) IMPLANT
CANISTER SUCT 3000ML PPV (MISCELLANEOUS) ×4 IMPLANT
CHLORAPREP W/TINT 26 (MISCELLANEOUS) ×2 IMPLANT
CLIP APPLIE 11 MED OPEN (CLIP) ×2 IMPLANT
CLIP APPLIE 13 LRG OPEN (CLIP) IMPLANT
COVER SURGICAL LIGHT HANDLE (MISCELLANEOUS) ×2 IMPLANT
DRAIN CHANNEL 19F RND (DRAIN) IMPLANT
DRAPE LAPAROSCOPIC ABDOMINAL (DRAPES) ×2 IMPLANT
DRAPE WARM FLUID 44X44 (DRAPES) IMPLANT
DRSG MEPILEX POST OP 4X8 (GAUZE/BANDAGES/DRESSINGS) IMPLANT
ELECT BLADE 6.5 EXT (BLADE) ×2 IMPLANT
ELECT REM PT RETURN 9FT ADLT (ELECTROSURGICAL) ×1
ELECTRODE REM PT RTRN 9FT ADLT (ELECTROSURGICAL) ×2 IMPLANT
EVACUATOR SILICONE 100CC (DRAIN) IMPLANT
GAUZE SPONGE 4X4 12PLY STRL (GAUZE/BANDAGES/DRESSINGS) IMPLANT
GLOVE BIO SURGEON STRL SZ 6 (GLOVE) IMPLANT
GLOVE BIO SURGEON STRL SZ8 (GLOVE) ×2 IMPLANT
GLOVE BIOGEL PI IND STRL 6 (GLOVE) IMPLANT
GLOVE BIOGEL PI IND STRL 8 (GLOVE) ×2 IMPLANT
GLOVE SS BIOGEL STRL SZ 6.5 (GLOVE) IMPLANT
GOWN STRL REUS W/ TWL LRG LVL3 (GOWN DISPOSABLE) ×4 IMPLANT
GOWN STRL REUS W/ TWL XL LVL3 (GOWN DISPOSABLE) ×2 IMPLANT
GOWN STRL REUS W/TWL LRG LVL3 (GOWN DISPOSABLE) ×3
GOWN STRL REUS W/TWL XL LVL3 (GOWN DISPOSABLE) ×1
HANDLE SUCTION POOLE (INSTRUMENTS) IMPLANT
HEMOSTAT SURGICEL 2X14 (HEMOSTASIS) IMPLANT
KIT BASIN OR (CUSTOM PROCEDURE TRAY) ×2 IMPLANT
KIT TURNOVER KIT B (KITS) ×2 IMPLANT
NS IRRIG 1000ML POUR BTL (IV SOLUTION) ×4 IMPLANT
PACK GENERAL/GYN (CUSTOM PROCEDURE TRAY) ×2 IMPLANT
PAD ARMBOARD 7.5X6 YLW CONV (MISCELLANEOUS) ×4 IMPLANT
PENCIL SMOKE EVACUATOR (MISCELLANEOUS) ×2 IMPLANT
SPECIMEN JAR X LARGE (MISCELLANEOUS) ×2 IMPLANT
SPIKE FLUID TRANSFER (MISCELLANEOUS) IMPLANT
SPONGE INTESTINAL PEANUT (DISPOSABLE) IMPLANT
SPONGE SURGIFOAM ABS GEL 100 (HEMOSTASIS) IMPLANT
SPONGE T-LAP 18X18 ~~LOC~~+RFID (SPONGE) IMPLANT
STAPLER VISISTAT 35W (STAPLE) ×2 IMPLANT
SUCTION POOLE HANDLE (INSTRUMENTS)
SUT ETHILON 2 0 FS 18 (SUTURE) IMPLANT
SUT PDS AB 1 TP1 96 (SUTURE) ×4 IMPLANT
SUT SILK 0 TIES 10X30 (SUTURE) ×2 IMPLANT
SUT SILK 2 0 SH (SUTURE) ×4 IMPLANT
SUT SILK 2 0 TIES 10X30 (SUTURE) ×2 IMPLANT
SUT SILK 2 0SH CR/8 30 (SUTURE) ×2 IMPLANT
TOWEL GREEN STERILE (TOWEL DISPOSABLE) ×2 IMPLANT
TOWEL GREEN STERILE FF (TOWEL DISPOSABLE) ×2 IMPLANT
TRAY FOLEY MTR SLVR 14FR STAT (SET/KITS/TRAYS/PACK) IMPLANT
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
## 2023-01-01 NOTE — ED Notes (Signed)
Pt medicated for pain/nausea, no acute distress at this time. Call bell in reach.
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
## 2023-01-01 NOTE — Anesthesia Postprocedure Evaluation (Signed)
Anesthesia Post Note  Patient: Bridget [name]  Procedure(s) Performed: SPLENECTOMY     Patient location during evaluation: PACU Anesthesia Type: General Level of consciousness: sedated Pain management: pain level controlled Vital Signs Assessment: post-procedure vital signs reviewed and stable Respiratory status: spontaneous breathing and respiratory function stable Cardiovascular status: stable Postop Assessment: no apparent nausea or vomiting Anesthetic complications: no  No notable events documented.  Last Vitals:  Vitals:   01/01/23 1145 01/01/23 1200  BP: 117/69 117/68  Pulse: 73 71  Resp: 13 14  Temp:  37.1 C  SpO2: 96% 95%    Last Pain:  Vitals:   01/01/23 1137  TempSrc:   PainSc: 8                  Concettina Leth DANIEL

## 2023-01-01 NOTE — Op Note (Signed)
   Operative Note   Date: 01/01/2023  Procedure: exploratory laparotomy, splenectomy  Pre-op diagnosis: failure of non-operative management of spleen injury Post-op diagnosis: same  Indication and clinical history: The patient is a 57 y.o. year old female with failure of non-operative management of spleen injury     Surgeon: Jesusita Oka, MD  Anesthesiologist: Tobias Alexander, MD Anesthesia: General  Findings:  Specimen: spleen EBL: 50cc Drains/Implants: 9F JP, LUQ, exiting L abdomen  Disposition: PACU - hemodynamically stable.  Description of procedure: The patient was positioned supine on the operating room table. General anesthetic induction and intubation were uneventful. Foley catheter insertion was performed and was atraumatic. Time-out was performed verifying correct patient, procedure, signature of informed consent, and administration of pre-operative antibiotics. The abdomen was prepped and draped in the usual sterile fashion.  An upper midline incision was made and deepened through the fascia. The spleen was medialized and hilar control obtained using a Kelly clamp followed by suture ligation. The left upper quadrant was copiously irrigated until fluid returned clear. Hemostasis was confirmed. A 9F JP drain was placed in the left abdomen and positioned in the left upper quadrant.The fascia was closed with #1 PDS suture and the skin closed with staples.   Sterile dressings were applied. All sponge and instrument counts were correct at the conclusion of the procedure. The patient was awakened from anesthesia, extubated uneventfully, and transported to the PACU in good condition. There were no complications.    Jesusita Oka, MD General and [facility]

## 2023-01-01 NOTE — Anesthesia Procedure Notes (Signed)
Arterial Line Insertion Start/End3/27/2024 9:20 AM, 01/01/2023 9:30 AM Performed by: Anastasio Auerbach, CRNA  Patient location: Pre-op. Preanesthetic checklist: patient identified, IV checked, site marked, risks and benefits discussed, surgical consent, monitors and equipment checked, pre-op evaluation, timeout performed and anesthesia consent Lidocaine 1% used for infiltration Right, radial was placed Catheter size: 20 G Hand hygiene performed , maximum sterile barriers used  and Seldinger technique used Allen's test indicative of satisfactory collateral circulation Attempts: 1 Procedure performed without using ultrasound guided technique. Following insertion, Biopatch and dressing applied. Post procedure assessment: normal  Patient tolerated the procedure well with no immediate complications.

## 2023-01-01 NOTE — Discharge Instructions (Addendum)
Post op Splenectomy Vaccination The information listed below shows vaccinations which help protect your health if you do not have a functioning spleen. Follow up with a primary care provider, health department, or local pharmacy to complete the vaccination series.  Two months - Meningococcal conjugate (MenACWY) AND Meningococcal B (MenB)  One year - Meningococcal B (MenB)  Three years - Meningococcal B (MenB)  Five years - Meningococcal conjugate (MenACWY)   [facility], [place] [phone]  OPEN ABDOMINAL SURGERY: POST OP INSTRUCTIONS  Always review your discharge instruction sheet given to you by the facility where your surgery was performed.  IF YOU HAVE DISABILITY OR FAMILY LEAVE FORMS, YOU MUST BRING THEM TO THE OFFICE FOR PROCESSING.  PLEASE DO NOT GIVE THEM TO YOUR DOCTOR.  A prescription for pain medication may be given to you upon discharge.  Take your pain medication as prescribed, if needed.  If narcotic pain medicine is not needed, then you may take acetaminophen (Tylenol) or ibuprofen (Advil) as needed. Take your usually prescribed medications unless otherwise directed. If you need a refill on your pain medication, please contact your pharmacy. They will contact our office to request authorization.  Prescriptions will not be filled after 5pm or on week-ends. You should follow a light diet the first few days after arrival home, such as soup and crackers, pudding, etc.unless your doctor has advised otherwise. A high-fiber, low fat diet can be resumed as tolerated.   Be sure to include lots of fluids daily. Most patients will experience some swelling and bruising on the chest and neck area.  Ice packs will help.  Swelling and bruising can take several days to resolve Most patients will experience some swelling and bruising in the area of the incision. Ice pack will help. Swelling and bruising can take several days to resolve..  It is common to experience some  constipation if taking pain medication after surgery.  Increasing fluid intake and taking a stool softener will usually help or prevent this problem from occurring.  A mild laxative (Milk of Magnesia or Miralax) should be taken according to package directions if there are no bowel movements after 48 hours.  You may have steri-strips (small skin tapes) in place directly over the incision.  These strips should be left on the skin for 7-10 days.  If your surgeon used skin glue on the incision, you may shower in 24 hours.  The glue will flake off over the next 2-3 weeks.  Any sutures or staples will be removed at the office during your follow-up visit. You may find that a light gauze bandage over your incision may keep your staples from being rubbed or pulled. You may shower and replace the bandage daily. ACTIVITIES:  You may resume regular (light) daily activities beginning the next day--such as daily self-care, walking, climbing stairs--gradually increasing activities as tolerated.  You may have sexual intercourse when it is comfortable.  Refrain from any heavy lifting or straining until approved by your doctor. You may drive when you no longer are taking prescription pain medication, you can comfortably wear a seatbelt, and you can safely maneuver your car and apply brakes Return to Work: ___________________________________ Bridget [name] should see your doctor in the office for a follow-up appointment approximately two weeks after your surgery.  Make sure that you call for this appointment within a day or two after you arrive home to insure a convenient appointment time. OTHER INSTRUCTIONS:  _____________________________________________________________ _____________________________________________________________  [id number]  TO CALL YOUR DOCTOR: Fever over 101.0 Inability to urinate Nausea and/or vomiting Extreme swelling or bruising Continued bleeding from incision. Increased pain, redness, or drainage from the  incision. Difficulty swallowing or breathing Muscle cramping or spasms. Numbness or tingling in hands or feet or around lips.  The clinic staff is available to answer your questions during regular business hours.  Please don't hesitate to call and ask to speak to one of the nurses if you have concerns.  For further questions, please visit [email or web address]

## 2023-01-01 NOTE — Progress Notes (Signed)
PT Cancellation Note  Patient Details Name: Bridget [name] MRN: [id number] DOB: Apr 11, 1966   Cancelled Treatment:    Reason Eval/Treat Not Completed: Pain limiting ability to participate. Pt declines PT today due to pain at surgical site. PT will follow up tomorrow.   Zenaida Niece 01/01/2023, 4:45 PM

## 2023-01-01 NOTE — ED Notes (Signed)
Changed and cleaned pt up. Pt in pain on left side. MD aware. Call bell in reach, family @ bedside.

## 2023-01-01 NOTE — Anesthesia Procedure Notes (Signed)
Procedure Name: Intubation Date/Time: 01/01/2023 10:02 AM  Performed by: Mariea Clonts, CRNAPre-anesthesia Checklist: Patient identified, Emergency Drugs available, Suction available and Patient being monitored Patient Re-evaluated:Patient Re-evaluated prior to induction Oxygen Delivery Method: Circle System Utilized Preoxygenation: Pre-oxygenation with 100% oxygen Induction Type: IV induction Ventilation: Mask ventilation without difficulty Laryngoscope Size: Mac and 3 Grade View: Grade I Tube type: Oral Tube size: 7.0 mm Number of attempts: 1 Airway Equipment and Method: Stylet and Oral airway Placement Confirmation: ETT inserted through vocal cords under direct vision, positive ETCO2 and breath sounds checked- equal and bilateral Tube secured with: Tape Dental Injury: Teeth and Oropharynx as per pre-operative assessment

## 2023-01-01 NOTE — Anesthesia Preprocedure Evaluation (Signed)
Anesthesia Evaluation  Patient identified by MRN, date of birth, ID band Patient awake    Reviewed: Allergy & Precautions, H&P , NPO status , Patient's Chart, lab work & pertinent test results  History of Anesthesia Complications Negative for: history of anesthetic complications  Airway Mallampati: II  TM Distance: >3 FB Neck ROM: Full    Dental no notable dental hx. (+) Edentulous Upper, Dental Advisory Given   Pulmonary asthma , Current Smoker and Patient abstained from smoking.   Pulmonary exam normal breath sounds clear to auscultation       Cardiovascular Exercise Tolerance: Good hypertension, Pt. on medications and Pt. on home beta blockers  Rhythm:Regular Rate:Normal     Neuro/Psych  Headaches PSYCHIATRIC DISORDERS Anxiety Depression       GI/Hepatic negative GI ROS, Neg liver ROS,,,  Endo/Other   Hyperthyroidism   Renal/GU negative Renal ROS  negative genitourinary   Musculoskeletal  (+) Arthritis , Osteoarthritis,    Abdominal   Peds  Hematology  (+) Blood dyscrasia, anemia   Anesthesia Other Findings   Reproductive/Obstetrics negative OB ROS                             Anesthesia Physical Anesthesia Plan  ASA: 3  Anesthesia Plan: General   Post-op Pain Management: Tylenol PO (pre-op)* and Toradol IV (intra-op)*   Induction: Intravenous  PONV Risk Score and Plan: 3 and Ondansetron, Dexamethasone and Midazolam  Airway Management Planned: Oral ETT  Additional Equipment:   Intra-op Plan:   Post-operative Plan: Extubation in OR  Informed Consent: I have reviewed the patients History and Physical, chart, labs and discussed the procedure including the risks, benefits and alternatives for the proposed anesthesia with the patient or authorized representative who has indicated his/her understanding and acceptance.     Dental advisory given  Plan Discussed with: CRNA  and Anesthesiologist  Anesthesia Plan Comments:         Anesthesia Quick Evaluation
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
## 2023-01-01 NOTE — Transfer of Care (Signed)
Immediate Anesthesia Transfer of Care Note  Patient: Bridget [name]  Procedure(s) Performed: SPLENECTOMY  Patient Location: PACU  Anesthesia Type:General  Level of Consciousness: awake, alert , and oriented  Airway & Oxygen Therapy: Patient Spontanous Breathing and Patient connected to face mask oxygen  Post-op Assessment: Report given to RN and Post -op Vital signs reviewed and stable  Post vital signs: Reviewed and stable  Last Vitals:  Vitals Value Taken Time  BP 135/82 01/01/23 1115  Temp    Pulse 73 01/01/23 1120  Resp 14 01/01/23 1120  SpO2 90 % 01/01/23 1120  Vitals shown include unvalidated device data.  Last Pain:  Vitals:   01/01/23 0749  TempSrc: Oral  PainSc:       Patients Stated Pain Goal: 2 ([phone]  Complications: No notable events documented.
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
## 2023-01-02 LAB — CBC
HCT: 34.2 % — ABNORMAL LOW (ref 36.0–46.0)
Hemoglobin: 11.2 g/dL — ABNORMAL LOW (ref 12.0–15.0)
MCH: 29.9 pg (ref 26.0–34.0)
MCHC: 32.7 g/dL (ref 30.0–36.0)
MCV: 91.4 fL (ref 80.0–100.0)
Platelets: 580 10*3/uL — ABNORMAL HIGH (ref 150–400)
RBC: 3.74 MIL/uL — ABNORMAL LOW (ref 3.87–5.11)
RDW: 13.9 % (ref 11.5–15.5)
WBC: 13.5 10*3/uL — ABNORMAL HIGH (ref 4.0–10.5)
nRBC: 0 % (ref 0.0–0.2)

## 2023-01-02 LAB — SURGICAL PATHOLOGY
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
## 2023-01-02 NOTE — Progress Notes (Signed)
Inpatient Rehab Admissions Coordinator:   Per therapy recommendations pt was screened for CIR by Shann Medal, PT, DPT.  At this time, pt does not appear to have the medical complexity to support a CIR admission.  Recommend f/u with alternate rehab venues.    Shann Medal, PT, DPT Admissions Coordinator [phone] 01/02/23  1:33 PM
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
## 2023-01-03 LAB — CBC
HCT: 35.2 % — ABNORMAL LOW (ref 36.0–46.0)
Hemoglobin: 11.3 g/dL — ABNORMAL LOW (ref 12.0–15.0)
MCH: 30 pg (ref 26.0–34.0)
MCHC: 32.1 g/dL (ref 30.0–36.0)
MCV: 93.4 fL (ref 80.0–100.0)
Platelets: 658 10*3/uL — ABNORMAL HIGH (ref 150–400)
RBC: 3.77 MIL/uL — ABNORMAL LOW (ref 3.87–5.11)
RDW: 14 % (ref 11.5–15.5)
WBC: 12.8 10*3/uL — ABNORMAL HIGH (ref 4.0–10.5)
nRBC: 0 % (ref 0.0–0.2)

## 2023-01-03 LAB — BASIC METABOLIC PANEL
Anion gap: 14 (ref 5–15)
BUN: 11 mg/dL (ref 6–20)
CO2: 23 mmol/L (ref 22–32)
Calcium: 9 mg/dL (ref 8.9–10.3)
Chloride: 97 mmol/L — ABNORMAL LOW (ref 98–111)
Creatinine, Ser: 0.86 mg/dL (ref 0.44–1.00)
GFR, Estimated: >60 mL/min (ref >60)
Glucose, Bld: 80 mg/dL (ref 70–99)
Potassium: 3.9 mmol/L (ref 3.5–5.1)
Sodium: 134 mmol/L — ABNORMAL LOW (ref 135–145)

## 2023-01-03 MED ORDER — DOCUSATE SODIUM 100 MG PO CAPS
100.0000 mg | ORAL_CAPSULE | Freq: Two times a day (BID) | ORAL | Status: DC
  Administered 2023-01-03 – 2023-01-09 (×11): 100 mg via ORAL
  Filled 2023-01-03 (×12): qty 1

## 2023-01-03 MED ORDER — METHOCARBAMOL 750 MG PO TABS: 750.0000 mg | ORAL_TABLET | Freq: Three times a day (TID) | ORAL | Status: DC

## 2023-01-03 MED ORDER — MORPHINE SULFATE (PF) 2 MG/ML IV SOLN
2.0000 mg | INTRAVENOUS | Status: DC | PRN
  Administered 2023-01-04 (×3): 2 mg via INTRAVENOUS
  Filled 2023-01-03 (×3): qty 1

## 2023-01-03 MED ORDER — POLYETHYLENE GLYCOL 3350 17 G PO PACK
17.0000 g | PACK | Freq: Two times a day (BID) | ORAL | Status: DC
  Administered 2023-01-03 – 2023-01-09 (×9): 17 g via ORAL
  Filled 2023-01-03 (×11): qty 1

## 2023-01-03 MED ORDER — METHOCARBAMOL 500 MG PO TABS
1000.0000 mg | ORAL_TABLET | Freq: Three times a day (TID) | ORAL | Status: DC
  Administered 2023-01-03 – 2023-01-09 (×18): 1000 mg via ORAL
  Filled 2023-01-03 (×18): qty 2

## 2023-01-03 MED ORDER — ACETAMINOPHEN 500 MG PO TABS
1000.0000 mg | ORAL_TABLET | Freq: Four times a day (QID) | ORAL | Status: DC
  Administered 2023-01-03 – 2023-01-09 (×18): 1000 mg via ORAL
  Filled 2023-01-03 (×20): qty 2

## 2023-01-03 MED ORDER — ENOXAPARIN SODIUM 30 MG/0.3ML IJ SOSY
30.0000 mg | PREFILLED_SYRINGE | Freq: Two times a day (BID) | INTRAMUSCULAR | Status: DC
  Administered 2023-01-03 – 2023-01-09 (×12): 30 mg via SUBCUTANEOUS
  Filled 2023-01-03 (×13): qty 0.3

## 2023-01-03 MED ORDER — POLYETHYLENE GLYCOL 3350 17 G PO PACK: 17.0000 g | PACK | Freq: Every day | ORAL | Status: DC | PRN

## 2023-01-03 MED ORDER — OXYCODONE HCL 5 MG PO TABS
5.0000 mg | ORAL_TABLET | ORAL | Status: DC | PRN
  Administered 2023-01-03 – 2023-01-08 (×15): 10 mg via ORAL
  Administered 2023-01-08: 5 mg via ORAL
  Filled 2023-01-03 (×12): qty 2
  Filled 2023-01-03: qty 1
  Filled 2023-01-03 (×5): qty 2
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
## 2023-01-03 NOTE — Care Management Important Message (Signed)
Important Message  Patient Details  Name: Bridget [name] MRN: [id number] Date of Birth: [birth year]   Medicare Important Message Given:  Yes     Aryon Kibe 01/03/2023, 12:25 PM
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
## 2023-01-03 NOTE — TOC Progression Note (Signed)
Transition of Care [facility]) - Progression Note    Patient Details  Name: EVIA BARDOS MRN: [id number] Date of Birth: 1965/11/14  Transition of Care [facility]) CM/SW Contact  Ella Bodo, RN Phone Number: 01/03/2023, 4:24 PM  Clinical Narrative:    Patient with poor progression with therapies, likely due to pain.  PT recommending SNF; provider plans to work on pain control to see if this will improve her mobility.  If not, she is willing to considering SNF for rehab.  Will follow up with patient on Monday.     Expected Discharge Plan: [place] Barriers to Discharge: Continued Medical Work up  Expected Discharge Plan and Services   Discharge Planning Services: CM Consult   Living arrangements for the past 2 months: Single Family Home                                       Social Determinants of Health (SDOH) Interventions SDOH Screenings   Depression (PHQ2-9): Medium Risk (07/10/2021)  Tobacco Use: High Risk (01/01/2023)    Readmission Risk Interventions     No data to display         Reinaldo Raddle, RN, BSN  Trauma/Neuro ICU Case Manager [phone]
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
## 2023-01-04 LAB — CBC
HCT: 32 % — ABNORMAL LOW (ref 36.0–46.0)
Hemoglobin: 10.2 g/dL — ABNORMAL LOW (ref 12.0–15.0)
MCH: 29.8 pg (ref 26.0–34.0)
MCHC: 31.9 g/dL (ref 30.0–36.0)
MCV: 93.6 fL (ref 80.0–100.0)
Platelets: 703 10*3/uL — ABNORMAL HIGH (ref 150–400)
RBC: 3.42 MIL/uL — ABNORMAL LOW (ref 3.87–5.11)
RDW: 13.7 % (ref 11.5–15.5)
WBC: 11.3 10*3/uL — ABNORMAL HIGH (ref 4.0–10.5)
nRBC: 0 % (ref 0.0–0.2)
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
## 2023-01-05 DIAGNOSIS — F430 Acute stress reaction: Secondary | ICD-10-CM

## 2023-01-05 DIAGNOSIS — F4323 Adjustment disorder with mixed anxiety and depressed mood: Secondary | ICD-10-CM | POA: Insufficient documentation

## 2023-01-05 LAB — BPAM RBC
Blood Product Expiration Date: 202404132359
Blood Product Expiration Date: 202404132359
Blood Product Expiration Date: 202404182359
Blood Product Expiration Date: 202404182359
Blood Product Expiration Date: 202404202359
Blood Product Expiration Date: 202404212359
Blood Product Expiration Date: 202404212359
Blood Product Expiration Date: 202404212359
ISSUE DATE / TIME: 202403241218
ISSUE DATE / TIME: 202403271047
ISSUE DATE / TIME: 202403271411
ISSUE DATE / TIME: 202403271521
ISSUE DATE / TIME: 202403301147
ISSUE DATE / TIME: 202403301149
ISSUE DATE / TIME: 202403301456
Unit Type and Rh: 5100
Unit Type and Rh: 5100
Unit Type and Rh: 5100
Unit Type and Rh: 5100
Unit Type and Rh: 5100
Unit Type and Rh: 5100
Unit Type and Rh: 5100
Unit Type and Rh: 5100

## 2023-01-05 LAB — TYPE AND SCREEN
ABO/RH(D): O POS
Antibody Screen: NEGATIVE
Unit division: 0
Unit division: 0
Unit division: 0
Unit division: 0
Unit division: 0
Unit division: 0
Unit division: 0
Unit division: 0

## 2023-01-05 LAB — CBC
HCT: 31.6 % — ABNORMAL LOW (ref 36.0–46.0)
Hemoglobin: 10.5 g/dL — ABNORMAL LOW (ref 12.0–15.0)
MCH: 30.2 pg (ref 26.0–34.0)
MCHC: 33.2 g/dL (ref 30.0–36.0)
MCV: 90.8 fL (ref 80.0–100.0)
Platelets: 734 10*3/uL — ABNORMAL HIGH (ref 150–400)
RBC: 3.48 MIL/uL — ABNORMAL LOW (ref 3.87–5.11)
RDW: 13.4 % (ref 11.5–15.5)
WBC: 9.7 10*3/uL (ref 4.0–10.5)
nRBC: 0 % (ref 0.0–0.2)

## 2023-01-05 MED ORDER — MIRTAZAPINE 15 MG PO TABS
7.5000 mg | ORAL_TABLET | Freq: Every day | ORAL | Status: DC
  Administered 2023-01-05 – 2023-01-08 (×4): 7.5 mg via ORAL
  Filled 2023-01-05 (×4): qty 1

## 2023-01-05 MED ORDER — HYDROXYZINE HCL 10 MG PO TABS
10.0000 mg | ORAL_TABLET | Freq: Every day | ORAL | Status: DC
  Administered 2023-01-05 – 2023-01-08 (×4): 10 mg via ORAL
  Filled 2023-01-05 (×5): qty 1

## 2023-01-05 NOTE — NC FL2 (Signed)
[place] MEDICAID FL2 LEVEL OF CARE FORM     IDENTIFICATION  Patient Name: Bridget [name] Birthdate: [birth year] Sex: female Admission Date (Current Location): 12/31/2022  [facility] and [place] Number:  [employer] and Address:  The . [facility], [place] 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   Edema 01/01/2018   Cervical spinal stenosis 01/01/2018   Gait disturbance 02/14/2016   Dysesthesia 02/14/2016   Anxiety state 02/14/2016   Spondylosis with myelopathy, 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/09/2016   Female genuine stress incontinence 01/09/2016   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   Peripheral nerve disease 01/09/2016   Smokes tobacco daily 11/15/2015   Excessive and frequent menstruation with irregular cycle 08/14/2015   Hyperthyroidism, subclinical 07/19/2015   Beat, premature ventricular 07/19/2015   Awareness of heartbeats 07/05/2015   Dizziness 07/05/2015   Cardiac murmur 07/05/2015   Spinal cord lesion ([place]) 06/07/2015   Disease of spinal cord ([place]) 06/07/2015   Transverse myelitis ([place]) 06/06/2014   Neurogenic bowel 06/06/2014   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   Lumbar radiculopathy 06/06/2014   B-complex deficiency 06/22/2012   Cephalalgia 06/16/2012   Clinical depression 06/16/2012    Orientation RESPIRATION BLADDER Height & Weight     Self, Time, Situation, Place  Normal Incontinent Weight: 81.1 kg Height:   5\' 2"  (157.5 cm)  BEHAVIORAL SYMPTOMS/MOOD NEUROLOGICAL BOWEL NUTRITION STATUS      Continent Diet (Regular diet with thin liquids)  AMBULATORY STATUS COMMUNICATION OF NEEDS Skin   Extensive Assist Verbally Surgical wounds, Bruising (incision to abd/ JP drain/ bruising to abd and chest)                       Personal Care Assistance Level of Assistance  Bathing, Feeding, Dressing Bathing Assistance: Maximum assistance Feeding assistance: Limited assistance Dressing Assistance: Maximum assistance     Functional Limitations Info  Sight, Hearing, Speech Sight Info: Adequate Hearing Info: Adequate Speech Info: Adequate    SPECIAL CARE FACTORS FREQUENCY  PT (By licensed PT), OT (By licensed OT)     PT Frequency: 5x/wk OT Frequency: 5x/wk            Contractures Contractures Info: Not present    Additional Factors Info  Code Status, Allergies, Psychotropic Code Status Info: Full Allergies Info: Penicillins, Amoxicillin, Shellfish Allergy, Gadolinium, Penicillin G, Celecoxib, Iodine-131 Psychotropic Info: Robaxin 1000 mg TID         Current Medications (01/05/2023):  This is the current hospital active medication list Current Facility-Administered Medications  Medication Dose Route Frequency Provider Last Rate Last Admin   acetaminophen (TYLENOL) tablet 1,000 mg  1,000 mg Oral Q6H Maczis, Barth Kirks, PA-C   1,000 mg at 01/05/23 0503   docusate sodium (COLACE) capsule 100 mg  100 mg Oral BID Jillyn Ledger, PA-C   100 mg at 01/04/23 2206   enoxaparin (LOVENOX) injection 30 mg  30 mg Subcutaneous Q12H  Jillyn Ledger, PA-C   30 mg at 01/05/23 0300   methocarbamol (ROBAXIN) tablet 1,000 mg  1,000 mg Oral TID Jillyn Ledger, PA-C   1,000 mg at 01/05/23 0841   morphine (PF) 2 MG/ML injection 2-4 mg  2-4 mg Intravenous Q3H PRN Jillyn Ledger, PA-C   2 mg at 01/04/23 2308   ondansetron (ZOFRAN-ODT) disintegrating tablet 4 mg  4 mg Oral Q6H PRN Georganna Skeans, MD        Or   ondansetron [facility]) injection 4 mg  4 mg Intravenous Q6H PRN Georganna Skeans, MD   4 mg at 01/04/23 2309   oxyCODONE (Oxy IR/ROXICODONE) immediate release tablet 5-10 mg  5-10 mg Oral Q4H PRN Jillyn Ledger, PA-C   10 mg at 01/05/23 0503   pantoprazole (PROTONIX) EC tablet 40 mg  40 mg Oral Daily Georganna Skeans, MD   40 mg at 01/05/23 0841   polyethylene glycol (MIRALAX / GLYCOLAX) packet 17 g  17 g Oral BID Jillyn Ledger, PA-C   17 g at 01/04/23 2205     Discharge Medications: Please see discharge summary for a list of discharge medications.  Relevant Imaging Results:  Relevant Lab Results:   Additional Information SS#: [id number]  Pollie Friar, RN
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
## 2023-01-05 NOTE — Progress Notes (Signed)
Re: Bridget [name] DOB:Mar 30, 1966 Date:01/05/2023   To Whom It May Concern:  Please be advised that the above-named patient will require a short-term nursing home stay--anticipated 30 days or less for rehabilitation and strengthening. The plan is for home.

## 2023-01-05 NOTE — TOC Progression Note (Signed)
Transition of Care [facility]) - Progression Note    Patient Details  Name: WAREESHA GETSON MRN: [id number] Date of Birth: [birth year]  [facility] CM/SW Contact  Pollie Friar, RN Phone Number: 01/05/2023, 10:10 AM  Clinical Narrative:    CM asked to see patient about discharging home. Pt tearful on arrival. Pt and CM talked and she has decided to attend SNF rehab. CM  has faxed her out with her permission in [facility] area. CM has provided her with choice under [email or web address]  Pt will need PTAR transport at d/c. TOC will begin insurance auth once bed has been selected and facility has confirmed availability.   Expected Discharge Plan: [place] Barriers to Discharge: Continued Medical Work up  Expected Discharge Plan and Services   Discharge Planning Services: CM Consult   Living arrangements for the past 2 months: Single Family Home                                       Social Determinants of Health (SDOH) Interventions SDOH Screenings   Depression (PHQ2-9): Medium Risk (07/10/2021)  Tobacco Use: High Risk (01/01/2023)    Readmission Risk Interventions     No data to display
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
## 2023-01-06 DIAGNOSIS — F430 Acute stress reaction: Secondary | ICD-10-CM

## 2023-01-06 MED ORDER — PREGABALIN 25 MG PO CAPS
25.0000 mg | ORAL_CAPSULE | Freq: Three times a day (TID) | ORAL | Status: DC
  Administered 2023-01-06 – 2023-01-09 (×9): 25 mg via ORAL
  Filled 2023-01-06 (×9): qty 1

## 2023-01-06 MED ORDER — HAEMOPHILUS B POLYSAC CONJ VAC IM SOLR
0.5000 mL | INTRAMUSCULAR | Status: AC | PRN
  Administered 2023-01-09: 0.5 mL via INTRAMUSCULAR
  Filled 2023-01-06: qty 0.5

## 2023-01-06 MED ORDER — PNEUMOCOCCAL 20-VAL CONJ VACC 0.5 ML IM SUSY
0.5000 mL | PREFILLED_SYRINGE | INTRAMUSCULAR | Status: AC | PRN
  Administered 2023-01-09: 0.5 mL via INTRAMUSCULAR
  Filled 2023-01-06: qty 0.5

## 2023-01-06 MED ORDER — MENINGOCOCCAL A C Y&W-135 OLIG IM SOLR
0.5000 mL | INTRAMUSCULAR | Status: AC | PRN
  Administered 2023-01-09: 0.5 mL via INTRAMUSCULAR
  Filled 2023-01-06: qty 0.5

## 2023-01-06 MED ORDER — ALBUTEROL SULFATE (2.5 MG/3ML) 0.083% IN NEBU: 3.0000 mL | INHALATION_SOLUTION | Freq: Four times a day (QID) | RESPIRATORY_TRACT | Status: DC | PRN

## 2023-01-06 MED ORDER — MENINGOCOCCAL VAC B (OMV) IM SUSY
0.5000 mL | PREFILLED_SYRINGE | INTRAMUSCULAR | Status: AC | PRN
  Administered 2023-01-09: 0.5 mL via INTRAMUSCULAR
  Filled 2023-01-06: qty 0.5

## 2023-01-06 NOTE — TOC Progression Note (Signed)
Transition of Care [facility]) - Progression Note    Patient Details  Name: Bridget [name] MRN: [id number] Date of Birth: 1966-03-01  [facility]) CM/SW Contact  Ella Bodo, RN Phone Number: 01/06/2023, 4:04 PM  Clinical Narrative:    Patient has been faxed out for SNF search, and currently patient has no bed offers.  Prefers [facility], but they have declined.  [place] are reviewing referral. Will follow with updates as available.    Expected Discharge Plan: [place] Barriers to Discharge: Continued Medical Work up  Expected Discharge Plan and Services   Discharge Planning Services: CM Consult   Living arrangements for the past 2 months: Single Family Home                                       Social Determinants of Health (SDOH) Interventions SDOH Screenings   Depression (PHQ2-9): Medium Risk (07/10/2021)  Tobacco Use: High Risk (01/01/2023)    Readmission Risk Interventions     No data to display         Reinaldo Raddle, RN, BSN  Trauma/Neuro ICU Case Manager [phone]
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
## 2023-01-07 DIAGNOSIS — F4323 Adjustment disorder with mixed anxiety and depressed mood: Secondary | ICD-10-CM

## 2023-01-07 NOTE — Progress Notes (Signed)
Pt had 75 ml of urine output in her canister since beginning of shift. Pt voices having the urge to void but unable to on multiple attempt. At shift change pt still not able to void; pt in and out catheterize per protocol and had 300 ml tea color urine without any sediments. Pericare completed with Public house manager. Report given to oncoming RN. Delia Heady RN

## 2023-01-07 NOTE — TOC Progression Note (Signed)
Transition of Care [facility]) - Progression Note    Patient Details  Name: MARDELLE RADZINSKI MRN: [id number] Date of Birth: 08-07-1966  [facility] CM/SW Contact  Ella Bodo, RN Phone Number: 01/07/2023, 3:56 PM  Clinical Narrative:    Patient still has no bed offers in [facility].  We discussed faxing her information out to a larger area, to include [place].  She is agreeable to this plan; motivated to go to SNF for rehab.  Will follow with updates as available.    Expected Discharge Plan: [place] Barriers to Discharge: Continued Medical Work up  Expected Discharge Plan and Services   Discharge Planning Services: CM Consult   Living arrangements for the past 2 months: Single Family Home                                       Social Determinants of Health (SDOH) Interventions SDOH Screenings   Depression (PHQ2-9): Medium Risk (07/10/2021)  Tobacco Use: High Risk (01/01/2023)    Readmission Risk Interventions     No data to display         Reinaldo Raddle, RN, BSN  Trauma/Neuro ICU Case Manager [phone]
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
## 2023-01-07 NOTE — Care Management Important Message (Signed)
Important Message  Patient Details  Name: Bridget [name] MRN: [id number] Date of Birth: [birth year]   Medicare Important Message Given:  Yes     Alysha Draeger 01/07/2023, 11:29 AM

## 2023-01-08 LAB — URINALYSIS, ROUTINE W REFLEX MICROSCOPIC
Glucose, UA: NEGATIVE mg/dL
Hgb urine dipstick: NEGATIVE
Ketones, ur: NEGATIVE mg/dL
Leukocytes,Ua: NEGATIVE
Nitrite: NEGATIVE
Protein, ur: NEGATIVE mg/dL
Specific Gravity, Urine: 1.043 — ABNORMAL HIGH (ref 1.005–1.030)
pH: 5 (ref 5.0–8.0)

## 2023-01-08 MED ORDER — TAMSULOSIN HCL 0.4 MG PO CAPS
0.4000 mg | ORAL_CAPSULE | Freq: Every day | ORAL | Status: DC
  Administered 2023-01-08 – 2023-01-09 (×2): 0.4 mg via ORAL
  Filled 2023-01-08 (×2): qty 1

## 2023-01-08 NOTE — TOC Progression Note (Addendum)
Transition of Care [facility]) - Progression Note    Patient Details  Name: DEZARAY TOMBS MRN: [id number] Date of Birth: 1966/06/11  Transition of [facility]) CM/SW Contact  Ella Bodo, RN Phone Number: 01/08/2023, 11:50am  Clinical Narrative:    Bed offers given to patient with ratings.  She selects [facility] for short term rehab.  Will initiate insurance authorization through [facility], Candace Cruise ID [id number]   Will provide updates as available.   [id number] PASSR # received from [facility] MUST: [id number] E Approved by [facility] for admission to [facility]; 01/09/2023-01/11/2023; NRD 01/11/2023.   Provider notified; bed available for 01/09/2023.  Plan dc in AM.    Expected Discharge Plan: [place] Barriers to Discharge: Continued Medical Work up  Expected Discharge Plan and Services   Discharge Planning Services: CM Consult   Living arrangements for the past 2 months: Single Family Home                                       Social Determinants of Health (SDOH) Interventions SDOH Screenings   Depression (PHQ2-9): Medium Risk (07/10/2021)  Tobacco Use: High Risk (01/01/2023)    Readmission Risk Interventions     No data to display         Reinaldo Raddle, RN, BSN  Trauma/Neuro ICU Case Manager [phone]
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
## 2023-01-09 MED ORDER — MIRTAZAPINE 7.5 MG PO TABS: 7.5000 mg | ORAL_TABLET | Freq: Every day | ORAL | Status: AC

## 2023-01-09 MED ORDER — TAMSULOSIN HCL 0.4 MG PO CAPS: 0.4000 mg | ORAL_CAPSULE | Freq: Every day | ORAL | Status: AC

## 2023-01-09 MED ORDER — DOCUSATE SODIUM 100 MG PO CAPS: 100.0000 mg | ORAL_CAPSULE | Freq: Two times a day (BID) | ORAL | 0 refills | Status: DC | PRN

## 2023-01-09 MED ORDER — OXYCODONE HCL 5 MG PO TABS: 5.0000 mg | ORAL_TABLET | ORAL | 0 refills | Status: AC | PRN

## 2023-01-09 MED ORDER — HYDROXYZINE HCL 10 MG PO TABS: 10.0000 mg | ORAL_TABLET | Freq: Every day | ORAL | 0 refills | Status: DC

## 2023-01-09 MED ORDER — AMITRIPTYLINE HCL 100 MG PO TABS: 100.0000 mg | ORAL_TABLET | Freq: Every day | ORAL | Status: AC

## 2023-01-09 MED ORDER — SERTRALINE HCL 25 MG PO TABS: 25.0000 mg | ORAL_TABLET | Freq: Every day | ORAL | Status: AC

## 2023-01-09 NOTE — TOC Transition Note (Signed)
Transition of Care [facility]) - CM/SW Discharge Note   Patient Details  Name: COREA NEYRA MRN: [id number] Date of Birth: 15-Apr-1966  Transition of Care [facility] CM/SW Contact:  Ella Bodo, RN Phone Number: 01/09/2023, 11:51 AM   Clinical Narrative:    Patient medically stable for discharge to [facility] today.  Met with patient; she is ready to start rehab.  Bedside nurse may call report [phone]; patient to admit to room 127B.  [facility] notified for transport at 11:41AM.     Final next level of care: Skilled Nursing Facility Barriers to Discharge: Barriers Resolved   Patient Goals and CMS Choice      Discharge Placement PASRR number recieved: 01/08/23 PASRR number recieved: 01/08/23            Patient chooses bed at: [facility] Patient to be transferred to facility by: Leonardo Name of family member notified: Patient calling her son Patient and family notified of of transfer: 01/09/23  Discharge Plan and Services Additional resources added to the After Visit Summary for  NA   Discharge Planning Services: CM Consult                                 Social Determinants of Health (SDOH) Interventions SDOH Screenings   Depression (PHQ2-9): Medium Risk (07/10/2021)  Tobacco Use: High Risk (01/01/2023)     Readmission Risk Interventions     No data to display         Reinaldo Raddle, RN, BSN  Trauma/Neuro ICU Case Manager [phone]
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
## 2023-01-16 NOTE — Progress Notes (Signed)
Acute blood loss anemia

## 2023-01-16 NOTE — Progress Notes (Signed)
Spleen with necrosis is not a clinically significant diagnosis

## 2023-02-15 ENCOUNTER — Encounter (HOSPITAL_COMMUNITY): Payer: Self-pay

## 2023-02-15 ENCOUNTER — Emergency Department (HOSPITAL_COMMUNITY)
Admission: EM | Admit: 2023-02-15 | Discharge: 2023-02-16 | Disposition: A | Discharge status: Home / Self Care | Payer: 59 | Attending: Emergency Medicine | Admitting: Emergency Medicine

## 2023-02-15 ENCOUNTER — Other Ambulatory Visit: Payer: Self-pay

## 2023-02-15 DIAGNOSIS — R110 Nausea: Secondary | ICD-10-CM | POA: Insufficient documentation

## 2023-02-15 DIAGNOSIS — N390 Urinary tract infection, site not specified: Secondary | ICD-10-CM | POA: Diagnosis not present

## 2023-02-15 DIAGNOSIS — R109 Unspecified abdominal pain: Secondary | ICD-10-CM | POA: Diagnosis present

## 2023-02-15 LAB — COMPREHENSIVE METABOLIC PANEL
ALT: 12 U/L (ref 0–44)
AST: 23 U/L (ref 15–41)
Albumin: 2.4 g/dL — ABNORMAL LOW (ref 3.5–5.0)
Alkaline Phosphatase: 48 U/L (ref 38–126)
Anion gap: 5 (ref 5–15)
BUN: 11 mg/dL (ref 6–20)
CO2: 16 mmol/L — ABNORMAL LOW (ref 22–32)
Calcium: 6.5 mg/dL — ABNORMAL LOW (ref 8.9–10.3)
Chloride: 116 mmol/L — ABNORMAL HIGH (ref 98–111)
Creatinine, Ser: 0.61 mg/dL (ref 0.44–1.00)
GFR, Estimated: >60 mL/min (ref >60)
Glucose, Bld: 91 mg/dL (ref 70–99)
Potassium: 4 mmol/L (ref 3.5–5.1)
Sodium: 137 mmol/L (ref 135–145)
Total Bilirubin: 1.1 mg/dL (ref 0.3–1.2)
Total Protein: 4.6 g/dL — ABNORMAL LOW (ref 6.5–8.1)

## 2023-02-15 LAB — CBC
HCT: 36.7 % (ref 36.0–46.0)
Hemoglobin: 11.7 g/dL — ABNORMAL LOW (ref 12.0–15.0)
MCH: 29 pg (ref 26.0–34.0)
MCHC: 31.9 g/dL (ref 30.0–36.0)
MCV: 90.8 fL (ref 80.0–100.0)
Platelets: 368 10*3/uL (ref 150–400)
RBC: 4.04 MIL/uL (ref 3.87–5.11)
RDW: 15.1 % (ref 11.5–15.5)
WBC: 9.6 10*3/uL (ref 4.0–10.5)
nRBC: 0 % (ref 0.0–0.2)

## 2023-02-15 MED ORDER — HYDROMORPHONE HCL 1 MG/ML IJ SOLN
0.5000 mg | INTRAMUSCULAR | Status: DC | PRN
  Administered 2023-02-15 – 2023-02-16 (×2): 0.5 mg via INTRAVENOUS
  Filled 2023-02-15 (×2): qty 1

## 2023-02-15 MED ORDER — ONDANSETRON HCL 4 MG/2ML IJ SOLN
4.0000 mg | Freq: Once | INTRAMUSCULAR | Status: AC
  Administered 2023-02-15: 4 mg via INTRAVENOUS
  Filled 2023-02-15: qty 2

## 2023-02-15 MED ORDER — ONDANSETRON 4 MG PO TBDP: 4.0000 mg | ORAL_TABLET | Freq: Three times a day (TID) | ORAL | Status: DC | PRN
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
## 2023-02-15 NOTE — ED Notes (Signed)
Provider at bedside

## 2023-02-16 ENCOUNTER — Emergency Department (HOSPITAL_COMMUNITY): Payer: 59

## 2023-02-16 DIAGNOSIS — N390 Urinary tract infection, site not specified: Secondary | ICD-10-CM | POA: Diagnosis not present

## 2023-02-16 LAB — URINALYSIS, ROUTINE W REFLEX MICROSCOPIC
Bilirubin Urine: NEGATIVE
Glucose, UA: NEGATIVE mg/dL
Hgb urine dipstick: NEGATIVE
Ketones, ur: NEGATIVE mg/dL
Nitrite: POSITIVE — AB
Protein, ur: 30 mg/dL — AB
Specific Gravity, Urine: >1.046 — ABNORMAL HIGH (ref 1.005–1.030)
WBC, UA: >50 WBC/hpf (ref 0–5)
pH: 5 (ref 5.0–8.0)

## 2023-02-16 MED ORDER — FOSFOMYCIN TROMETHAMINE 3 G PO PACK
3.0000 g | PACK | Freq: Once | ORAL | Status: AC
  Administered 2023-02-16: 3 g via ORAL
  Filled 2023-02-16: qty 3

## 2023-02-16 MED ORDER — LACTATED RINGERS IV BOLUS
1000.0000 mL | Freq: Once | INTRAVENOUS | Status: AC
  Administered 2023-02-16: 1000 mL via INTRAVENOUS

## 2023-02-16 MED ORDER — OXYCODONE-ACETAMINOPHEN 5-325 MG PO TABS
1.0000 | ORAL_TABLET | Freq: Once | ORAL | Status: AC
  Administered 2023-02-16: 1 via ORAL
  Filled 2023-02-16: qty 1

## 2023-02-16 MED ORDER — IOHEXOL 350 MG/ML SOLN
75.0000 mL | Freq: Once | INTRAVENOUS | Status: AC | PRN
  Administered 2023-02-16: 75 mL via INTRAVENOUS

## 2023-02-16 MED ORDER — DIPHENHYDRAMINE HCL 50 MG/ML IJ SOLN
25.0000 mg | Freq: Once | INTRAMUSCULAR | Status: AC
  Administered 2023-02-16: 25 mg via INTRAVENOUS
  Filled 2023-02-16: qty 1

## 2023-02-16 NOTE — ED Notes (Signed)
Itching has subsided

## 2023-02-16 NOTE — ED Provider Notes (Signed)
Signed out to me awaiting urinalysis and CT.  Patient with recent splenic injury requiring splenectomy.  CT scan has been performed and shows sequela of surgery without complication.  Patient did have some brief hypotension after Dilaudid which resolved with IV fluids.  Urinalysis has returned and shows clear evidence of urinary tract infection.  Treated with fosfomycin.   Gilda Crease, MD 02/16/23 [phone]

## 2023-02-16 NOTE — ED Notes (Addendum)
Pt reports diffuse itching without rash after second dose of Dilaudid. Dr Blinda Leatherwood aware and Benadryl ordered

## 2023-02-16 NOTE — ED Notes (Signed)
Pt taken to CT.

## 2023-02-16 NOTE — ED Notes (Signed)
Spoke with CT and pt to go to CT soon.

## 2023-06-11 ENCOUNTER — Emergency Department (HOSPITAL_COMMUNITY): Payer: Medicare Other

## 2023-06-11 ENCOUNTER — Emergency Department (HOSPITAL_COMMUNITY)
Admission: EM | Admit: 2023-06-11 | Discharge: 2023-06-12 | Disposition: A | Discharge status: Home / Self Care | Payer: Medicare Other | Attending: Emergency Medicine | Admitting: Emergency Medicine

## 2023-06-11 ENCOUNTER — Other Ambulatory Visit: Payer: Self-pay

## 2023-06-11 ENCOUNTER — Encounter (HOSPITAL_COMMUNITY): Payer: Self-pay | Admitting: Emergency Medicine

## 2023-06-11 DIAGNOSIS — R1010 Upper abdominal pain, unspecified: Secondary | ICD-10-CM | POA: Diagnosis present

## 2023-06-11 DIAGNOSIS — R1033 Periumbilical pain: Secondary | ICD-10-CM | POA: Diagnosis not present

## 2023-06-11 DIAGNOSIS — R600 Localized edema: Secondary | ICD-10-CM | POA: Diagnosis not present

## 2023-06-11 DIAGNOSIS — D72829 Elevated white blood cell count, unspecified: Secondary | ICD-10-CM | POA: Insufficient documentation

## 2023-06-11 DIAGNOSIS — R1013 Epigastric pain: Secondary | ICD-10-CM | POA: Insufficient documentation

## 2023-06-11 LAB — CBC
HCT: 40.1 % (ref 36.0–46.0)
Hemoglobin: 13.3 g/dL (ref 12.0–15.0)
MCH: 30.3 pg (ref 26.0–34.0)
MCHC: 33.2 g/dL (ref 30.0–36.0)
MCV: 91.3 fL (ref 80.0–100.0)
Platelets: 383 10*3/uL (ref 150–400)
RBC: 4.39 MIL/uL (ref 3.87–5.11)
RDW: 14.9 % (ref 11.5–15.5)
WBC: 12.5 10*3/uL — ABNORMAL HIGH (ref 4.0–10.5)
nRBC: 0 % (ref 0.0–0.2)

## 2023-06-11 LAB — TROPONIN I (HIGH SENSITIVITY)
Troponin I (High Sensitivity): 7 ng/L (ref <18)
Troponin I (High Sensitivity): 3 ng/L (ref <18)

## 2023-06-11 LAB — D-DIMER, QUANTITATIVE: D-Dimer, Quant: 0.54 ug{FEU}/mL — ABNORMAL HIGH (ref 0.00–0.50)

## 2023-06-11 LAB — BASIC METABOLIC PANEL
Anion gap: 12 (ref 5–15)
BUN: 13 mg/dL (ref 6–20)
CO2: 24 mmol/L (ref 22–32)
Calcium: 9.3 mg/dL (ref 8.9–10.3)
Chloride: 104 mmol/L (ref 98–111)
Creatinine, Ser: 0.83 mg/dL (ref 0.44–1.00)
GFR, Estimated: >60 mL/min (ref >60)
Glucose, Bld: 94 mg/dL (ref 70–99)
Potassium: 3.8 mmol/L (ref 3.5–5.1)
Sodium: 140 mmol/L (ref 135–145)

## 2023-06-11 LAB — LIPASE, BLOOD: Lipase: 37 U/L (ref 11–51)

## 2023-06-11 LAB — HEPATIC FUNCTION PANEL
ALT: 17 U/L (ref 0–44)
AST: 18 U/L (ref 15–41)
Albumin: 4.1 g/dL (ref 3.5–5.0)
Alkaline Phosphatase: 70 U/L (ref 38–126)
Bilirubin, Direct: <0.1 mg/dL (ref 0.0–0.2)
Total Bilirubin: 0.7 mg/dL (ref 0.3–1.2)
Total Protein: 7.7 g/dL (ref 6.5–8.1)

## 2023-06-11 MED ORDER — IOHEXOL 350 MG/ML SOLN
75.0000 mL | Freq: Once | INTRAVENOUS | Status: AC | PRN
  Administered 2023-06-12: 75 mL via INTRAVENOUS

## 2023-06-11 MED ORDER — LACTATED RINGERS IV BOLUS
1000.0000 mL | Freq: Once | INTRAVENOUS | Status: AC
  Administered 2023-06-11: 1000 mL via INTRAVENOUS

## 2023-06-11 MED ORDER — ONDANSETRON HCL 4 MG/2ML IJ SOLN
4.0000 mg | Freq: Once | INTRAMUSCULAR | Status: AC
  Administered 2023-06-11: 4 mg via INTRAVENOUS
  Filled 2023-06-11: qty 2

## 2023-06-11 MED ORDER — MORPHINE SULFATE (PF) 4 MG/ML IV SOLN
4.0000 mg | Freq: Once | INTRAVENOUS | Status: AC
  Administered 2023-06-11: 4 mg via INTRAVENOUS
  Filled 2023-06-11: qty 1
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
## 2023-06-11 NOTE — ED Triage Notes (Signed)
Patient c/o sharp chest pain starting yesterday around lunch time after eating lunch. Sharp stabbing pain. Patient has an incision upper abdomen in the central lower chest area and that's where the pain is. States it comes in waves. Reports nausea, SHOB, became diaphoretic yesterday initially.

## 2023-06-12 LAB — URINALYSIS, W/ REFLEX TO CULTURE (INFECTION SUSPECTED)
Bacteria, UA: NONE SEEN
Bilirubin Urine: NEGATIVE
Glucose, UA: NEGATIVE mg/dL
Hgb urine dipstick: NEGATIVE
Ketones, ur: NEGATIVE mg/dL
Leukocytes,Ua: NEGATIVE
Nitrite: NEGATIVE
Protein, ur: NEGATIVE mg/dL
Specific Gravity, Urine: 1.03 (ref 1.005–1.030)
pH: 6 (ref 5.0–8.0)
